# Patient Record
Sex: Female | Born: 1970 | Race: White | Hispanic: No | Marital: Married | Smoking: Former smoker
Health system: Southern US, Community
[De-identification: ages and names within clinical notes are randomized; demographics above are authoritative.]

## PROBLEM LIST (undated history)

## (undated) DIAGNOSIS — I10 Essential (primary) hypertension: Secondary | ICD-10-CM

## (undated) DIAGNOSIS — Z8489 Family history of other specified conditions: Secondary | ICD-10-CM

## (undated) HISTORY — PX: TYMPANOSTOMY TUBE PLACEMENT: SHX32

## (undated) HISTORY — PX: ADENOIDECTOMY AND MYRINGOTOMY WITH TUBE PLACEMENT: SHX5714

---

## 1997-12-01 ENCOUNTER — Ambulatory Visit (HOSPITAL_COMMUNITY): Admission: RE | Admit: 1997-12-01 | Discharge: 1997-12-01 | Payer: Self-pay | Admitting: Obstetrics and Gynecology

## 1997-12-02 ENCOUNTER — Ambulatory Visit (HOSPITAL_COMMUNITY): Admission: RE | Admit: 1997-12-02 | Discharge: 1997-12-02 | Payer: Self-pay | Admitting: Obstetrics and Gynecology

## 1998-02-25 ENCOUNTER — Inpatient Hospital Stay (HOSPITAL_COMMUNITY): Admission: AD | Admit: 1998-02-25 | Discharge: 1998-02-27 | Payer: Self-pay | Admitting: Obstetrics and Gynecology

## 1999-08-09 ENCOUNTER — Encounter: Admission: RE | Admit: 1999-08-09 | Discharge: 1999-08-09 | Payer: Self-pay | Admitting: Obstetrics and Gynecology

## 1999-08-09 ENCOUNTER — Encounter: Payer: Self-pay | Admitting: Obstetrics and Gynecology

## 1999-10-18 ENCOUNTER — Other Ambulatory Visit: Admission: RE | Admit: 1999-10-18 | Discharge: 1999-10-18 | Payer: Self-pay | Admitting: Obstetrics and Gynecology

## 2000-10-23 ENCOUNTER — Other Ambulatory Visit: Admission: RE | Admit: 2000-10-23 | Discharge: 2000-10-23 | Payer: Self-pay | Admitting: Obstetrics and Gynecology

## 2000-10-30 ENCOUNTER — Encounter: Admission: RE | Admit: 2000-10-30 | Discharge: 2000-10-30 | Payer: Self-pay | Admitting: Obstetrics and Gynecology

## 2000-10-30 ENCOUNTER — Encounter: Payer: Self-pay | Admitting: Obstetrics and Gynecology

## 2003-03-24 ENCOUNTER — Other Ambulatory Visit: Admission: RE | Admit: 2003-03-24 | Discharge: 2003-03-24 | Payer: Self-pay | Admitting: Obstetrics and Gynecology

## 2006-01-16 ENCOUNTER — Encounter: Admission: RE | Admit: 2006-01-16 | Discharge: 2006-01-16 | Payer: Self-pay | Admitting: Obstetrics and Gynecology

## 2010-03-01 ENCOUNTER — Encounter: Admission: RE | Admit: 2010-03-01 | Discharge: 2010-03-01 | Payer: Self-pay | Admitting: Obstetrics and Gynecology

## 2012-09-05 ENCOUNTER — Other Ambulatory Visit: Payer: Self-pay

## 2012-09-05 DIAGNOSIS — Z1231 Encounter for screening mammogram for malignant neoplasm of breast: Secondary | ICD-10-CM

## 2012-10-15 ENCOUNTER — Ambulatory Visit
Admission: RE | Admit: 2012-10-15 | Discharge: 2012-10-15 | Disposition: A | Discharge status: Home / Self Care | Payer: BC Managed Care – PPO | Source: Ambulatory Visit

## 2012-10-15 DIAGNOSIS — Z1231 Encounter for screening mammogram for malignant neoplasm of breast: Secondary | ICD-10-CM

## 2013-01-30 ENCOUNTER — Ambulatory Visit (INDEPENDENT_AMBULATORY_CARE_PROVIDER_SITE_OTHER): Payer: BC Managed Care – PPO | Admitting: General Surgery

## 2013-02-20 ENCOUNTER — Encounter (INDEPENDENT_AMBULATORY_CARE_PROVIDER_SITE_OTHER): Payer: Self-pay | Admitting: General Surgery

## 2013-02-20 ENCOUNTER — Ambulatory Visit (INDEPENDENT_AMBULATORY_CARE_PROVIDER_SITE_OTHER): Payer: BC Managed Care – PPO | Admitting: General Surgery

## 2013-02-20 VITALS — BP 118/76 | HR 72 | Temp 98.2°F | Resp 14 | Ht 64.0 in | Wt 185.4 lb

## 2013-02-20 DIAGNOSIS — K439 Ventral hernia without obstruction or gangrene: Secondary | ICD-10-CM

## 2013-02-20 NOTE — Patient Instructions (Signed)
Call office and asked for scheduling when you decide regarding timing of your surgery

## 2013-02-20 NOTE — Progress Notes (Signed)
Patient ID: Nicole Carr, female   DOB: 04/06/1971, 42 y.o.   MRN: [id number]  Chief Complaint  Patient presents with  . New Evaluation    eval ventral hernia    HPI Nicole Carr is a 42 y.o. female.  Chief complaint abdominal hernia HPIPatient has a two-month history of a painful mass on her abdomen above her umbilicus. She was seen by Dr. Cliffton Asters. Dr. Cliffton Asters asked me to evaluate her in consultation for possible hernia. The lump seems to get bigger and smaller over time. It bothers her more if she bumps into it or she does activity that use her abdominal muscles. No changes in bowel habits. She has no pain usually just a feeling of pressure in the area.  History reviewed. No pertinent past medical history.  History reviewed. No pertinent past surgical history.  Family History  Problem Relation Age of Onset  . Cancer Father     melenoma  . Cancer Sister     breast    Social History History  Substance Use Topics  . Smoking status: Former Smoker    Quit date: 10/22/2010  . Smokeless tobacco: Never Used  . Alcohol Use: Yes    No Known Allergies  Current Outpatient Prescriptions  Medication Sig Dispense Refill  . LORazepam (ATIVAN) 0.5 MG tablet Patient takes as needed      . Vitamin D, Ergocalciferol, (DRISDOL) 50000 UNITS CAPS capsule        No current facility-administered medications for this visit.    Review of Systems Review of Systems  Constitutional: Negative for fever, chills and unexpected weight change.  HENT: Negative for congestion, hearing loss, sore throat, trouble swallowing and voice change.   Eyes: Negative for visual disturbance.  Respiratory: Negative for cough and wheezing.   Cardiovascular: Negative for chest pain, palpitations and leg swelling.  Gastrointestinal: Positive for abdominal pain. Negative for nausea, vomiting, diarrhea, constipation, blood in stool, abdominal distention and anal bleeding.       See history of present illness    Genitourinary: Negative for hematuria, vaginal bleeding and difficulty urinating.  Musculoskeletal: Negative for arthralgias.  Skin: Negative for rash and wound.  Neurological: Negative for seizures, syncope and headaches.  Hematological: Negative for adenopathy. Does not bruise/bleed easily.  Psychiatric/Behavioral: Negative for confusion.    Blood pressure 118/76, pulse 72, temperature 98.2 F (36.8 C), temperature source Temporal, resp. rate 14, height 5\' 4"  (1.626 m), weight 185 lb 6.4 oz (84.097 kg).  Physical Exam Physical Exam  Constitutional: She is oriented to person, place, and time. She appears well-developed and well-nourished.  HENT:  Head: Normocephalic and atraumatic.  Eyes: EOM are normal. Pupils are equal, round, and reactive to light.  Neck: Normal range of motion. Neck supple. No tracheal deviation present.  Cardiovascular: Normal rate, normal heart sounds and intact distal pulses.   Pulmonary/Chest: Effort normal and breath sounds normal. No stridor. No respiratory distress. She has no wheezes. She has no rales.  Abdominal: Soft. She exhibits no distension. There is no rebound.    Epigastric hernia a couple centimeters above the umbilicus, incompletely reducible, some tenderness on palpation, no overlying skin redness  Musculoskeletal: Normal range of motion. She exhibits no edema.  Neurological: She is alert and oriented to person, place, and time.  Skin: Skin is warm and dry.    Data Reviewed Office notes from Dr. Cliffton Asters  Assessment    Epigastric hernia    Plan    I have offered laparoscopic repair  with mesh. Procedure, risks, and benefits were discussed in detail. She would like to check her schedule. She will likely choose to have it repaired beginning of the next year. She will call and speak with our schedulers after talking things over with her husband.I advised of signs and symptoms of strangulation and gave her some educational material regarding  laparoscopic hernia repair.       Mollie Rossano E 02/20/2013, 11:38 AM

## 2014-09-01 ENCOUNTER — Other Ambulatory Visit: Payer: Self-pay

## 2014-09-01 DIAGNOSIS — Z1231 Encounter for screening mammogram for malignant neoplasm of breast: Secondary | ICD-10-CM

## 2014-09-05 ENCOUNTER — Ambulatory Visit
Admission: RE | Admit: 2014-09-05 | Discharge: 2014-09-05 | Disposition: A | Discharge status: Home / Self Care | Payer: BLUE CROSS/BLUE SHIELD | Source: Ambulatory Visit

## 2014-09-05 DIAGNOSIS — Z1231 Encounter for screening mammogram for malignant neoplasm of breast: Secondary | ICD-10-CM

## 2015-08-25 ENCOUNTER — Other Ambulatory Visit: Payer: Self-pay

## 2015-08-25 DIAGNOSIS — Z1231 Encounter for screening mammogram for malignant neoplasm of breast: Secondary | ICD-10-CM

## 2015-09-14 ENCOUNTER — Ambulatory Visit
Admission: RE | Admit: 2015-09-14 | Discharge: 2015-09-14 | Disposition: A | Discharge status: Home / Self Care | Payer: BLUE CROSS/BLUE SHIELD | Source: Ambulatory Visit

## 2015-09-14 DIAGNOSIS — Z1231 Encounter for screening mammogram for malignant neoplasm of breast: Secondary | ICD-10-CM

## 2015-11-30 ENCOUNTER — Ambulatory Visit (INDEPENDENT_AMBULATORY_CARE_PROVIDER_SITE_OTHER): Payer: BLUE CROSS/BLUE SHIELD | Admitting: Licensed Clinical Social Worker

## 2015-11-30 DIAGNOSIS — F331 Major depressive disorder, recurrent, moderate: Secondary | ICD-10-CM

## 2015-12-01 ENCOUNTER — Ambulatory Visit: Payer: BLUE CROSS/BLUE SHIELD | Admitting: Licensed Clinical Social Worker

## 2015-12-15 ENCOUNTER — Ambulatory Visit (INDEPENDENT_AMBULATORY_CARE_PROVIDER_SITE_OTHER): Payer: BLUE CROSS/BLUE SHIELD | Admitting: Licensed Clinical Social Worker

## 2015-12-15 DIAGNOSIS — F331 Major depressive disorder, recurrent, moderate: Secondary | ICD-10-CM | POA: Diagnosis not present

## 2016-02-10 ENCOUNTER — Ambulatory Visit: Payer: Self-pay | Admitting: General Surgery

## 2016-03-25 NOTE — Pre-Procedure Instructions (Signed)
    Nicole Carr  03/25/2016      [facility] [place], [place] - [address] [place] [address] Phone: [phone] Fax: [phone]    Your procedure is scheduled on 04/04/16.  [facility] at 530 A.M.  Call this number if you have problems the morning of surgery:  [phone]   Remember:  Do not eat food or drink liquids after midnight.  Take these medicines the morning of surgery with A SIP OF WATER ---ativan   Do not wear jewelry, make-up or nail polish.  Do not wear lotions, powders, or perfumes, or deoderant.  Do not shave 48 hours prior to surgery.  Men may shave face and neck.  Do not bring valuables to the hospital.  [facility] is not responsible for any belongings or valuables.  Contacts, dentures or bridgework may not be worn into surgery.  Leave your suitcase in the car.  After surgery it may be brought to your room.  For patients admitted to the hospital, discharge time will be determined by your treatment team.  Patients discharged the day of surgery will not be allowed to drive home.   Name and phone number of your driver:   Special instructions:  Do not take any aspirin,anti-inflammatories,vitamins,or herbal supplements 5-7 days prior to surgery.  Please read over the following fact sheets that you were given.

## 2016-03-28 ENCOUNTER — Other Ambulatory Visit (HOSPITAL_COMMUNITY): Payer: Self-pay | Admitting: *Deleted

## 2016-03-28 ENCOUNTER — Encounter (HOSPITAL_COMMUNITY): Payer: Self-pay

## 2016-03-28 ENCOUNTER — Encounter (HOSPITAL_COMMUNITY)
Admission: RE | Admit: 2016-03-28 | Discharge: 2016-03-28 | Disposition: A | Discharge status: Home / Self Care | Payer: BLUE CROSS/BLUE SHIELD | Source: Ambulatory Visit | Attending: General Surgery | Admitting: General Surgery

## 2016-03-28 DIAGNOSIS — I10 Essential (primary) hypertension: Secondary | ICD-10-CM | POA: Insufficient documentation

## 2016-03-28 DIAGNOSIS — Z01810 Encounter for preprocedural cardiovascular examination: Secondary | ICD-10-CM | POA: Diagnosis present

## 2016-03-28 DIAGNOSIS — Z01812 Encounter for preprocedural laboratory examination: Secondary | ICD-10-CM | POA: Insufficient documentation

## 2016-03-28 HISTORY — DX: Family history of other specified conditions: Z84.89

## 2016-03-28 HISTORY — DX: Essential (primary) hypertension: I10

## 2016-03-28 LAB — BASIC METABOLIC PANEL
Anion gap: 6 (ref 5–15)
BUN: 10 mg/dL (ref 6–20)
CHLORIDE: 105 mmol/L (ref 101–111)
CO2: 26 mmol/L (ref 22–32)
CREATININE: 0.72 mg/dL (ref 0.44–1.00)
Calcium: 9.6 mg/dL (ref 8.9–10.3)
GFR calc non Af Amer: >60 mL/min (ref >60)
Glucose, Bld: 97 mg/dL (ref 65–99)
POTASSIUM: 3.9 mmol/L (ref 3.5–5.1)
SODIUM: 137 mmol/L (ref 135–145)

## 2016-03-28 LAB — CBC
HEMATOCRIT: 37.7 % (ref 36.0–46.0)
HEMOGLOBIN: 12.6 g/dL (ref 12.0–15.0)
MCH: 30.6 pg (ref 26.0–34.0)
MCHC: 33.4 g/dL (ref 30.0–36.0)
MCV: 91.5 fL (ref 78.0–100.0)
PLATELETS: 253 10*3/uL (ref 150–400)
RBC: 4.12 MIL/uL (ref 3.87–5.11)
RDW: 12.5 % (ref 11.5–15.5)
WBC: 6.6 10*3/uL (ref 4.0–10.5)

## 2016-03-28 NOTE — Pre-Procedure Instructions (Signed)
Nicole Carr  03/28/2016     Your procedure is scheduled on Monday, April 04, 2016 at 7:30 AM.   Report to [facility] "A" Admitting Office at 5:30 AM.   Call this number if you have problems the morning of surgery: [phone]   Questions prior to day of surgery, please call [phone] between 8 & 4 PM.   Remember:  Do not eat food or drink liquids after midnight Sunday 04/03/16.  Take these medicines the morning of surgery with A SIP OF WATER: Lorazepam (Ativan) - if needed  Stop NSAIDS (Ibuprofen, Aleve, etc.) as of today.   Do not wear jewelry, make-up or nail polish.  Do not wear lotions, powders, or perfumes.  Do not shave 48 hours prior to surgery.    Do not bring valuables to the hospital.  [facility] is not responsible for any belongings or valuables.  Contacts, dentures or bridgework may not be worn into surgery.  Leave your suitcase in the car.  After surgery it may be brought to your room.  For patients admitted to the hospital, discharge time will be determined by your treatment team.  Patients discharged the day of surgery will not be allowed to drive home.   Special instructions:  [facility] - Preparing for Surgery  Before surgery, you can play an important role.  Because skin is not sterile, your skin needs to be as free of germs as possible.  You can reduce the number of germs on you skin by washing with CHG (chlorahexidine gluconate) soap before surgery.  CHG is an antiseptic cleaner which kills germs and bonds with the skin to continue killing germs even after washing.  Please DO NOT use if you have an allergy to CHG or antibacterial soaps.  If your skin becomes reddened/irritated stop using the CHG and inform your nurse when you arrive at Short Stay.  Do not shave (including legs and underarms) for at least 48 hours prior to the first CHG shower.  You may shave your face.  Please follow these instructions carefully:   1.   Shower with CHG Soap the night before surgery and the                    morning of Surgery.  2.  If you choose to wash your hair, wash your hair first as usual with your       normal shampoo.  3.  After you shampoo, rinse your hair and body thoroughly to remove the shampoo.  4.  Use CHG as you would any other liquid soap.  You can apply chg directly       to the skin and wash gently with scrungie or a clean washcloth.  5.  Apply the CHG Soap to your body ONLY FROM THE NECK DOWN.        Do not use on open wounds or open sores.  Avoid contact with your eyes, ears, mouth and genitals (private parts).  Wash genitals (private parts) with your normal soap.  6.  Wash thoroughly, paying special attention to the area where your surgery        will be performed.  7.  Thoroughly rinse your body with warm water from the neck down.  8.  DO NOT shower/wash with your normal soap after using and rinsing off       the CHG Soap.  9.  Pat yourself dry with a clean towel.  10.  Wear clean pajamas.            11.  Place clean sheets on your bed the night of your first shower and do not        sleep with pets.  Day of Surgery  Do not apply any lotions the morning of surgery.  Please wear clean clothes to the hospital.   Please read over the fact sheets that you were given.

## 2016-04-03 ENCOUNTER — Encounter (HOSPITAL_COMMUNITY): Payer: Self-pay | Admitting: Anesthesiology

## 2016-04-03 NOTE — Anesthesia Preprocedure Evaluation (Addendum)
Anesthesia Evaluation  Patient identified by MRN, date of birth, ID band Patient awake    Reviewed: Allergy & Precautions, NPO status , Patient's Chart, lab work & pertinent test results  Airway Mallampati: II  TM Distance: >3 FB Neck ROM: Full    Dental  (+) Teeth Intact, Dental Advisory Given   Pulmonary former smoker,    breath sounds clear to auscultation       Cardiovascular hypertension, Pt. on medications  Rhythm:Regular Rate:Normal     Neuro/Psych negative neurological ROS  negative psych ROS   GI/Hepatic negative GI ROS, Neg liver ROS,   Endo/Other  negative endocrine ROS  Renal/GU negative Renal ROS  negative genitourinary   Musculoskeletal negative musculoskeletal ROS (+)   Abdominal   Peds negative pediatric ROS (+)  Hematology negative hematology ROS (+)   Anesthesia Other Findings   Reproductive/Obstetrics negative OB ROS                            Lab Results  Component Value Date   WBC 6.6 03/28/2016   HGB 12.6 03/28/2016   HCT 37.7 03/28/2016   MCV 91.5 03/28/2016   PLT 253 03/28/2016   Lab Results  Component Value Date   CREATININE 0.72 03/28/2016   BUN 10 03/28/2016   NA 137 03/28/2016   K 3.9 03/28/2016   CL 105 03/28/2016   CO2 26 03/28/2016   No results found for: INR, PROTIME  03/2016 EKG: NSR  Anesthesia Physical Anesthesia Plan  ASA: II  Anesthesia Plan: General   Post-op Pain Management:    Induction: Intravenous  Airway Management Planned: Oral ETT  Additional Equipment:   Intra-op Plan:   Post-operative Plan: Extubation in OR  Informed Consent: I have reviewed the patients History and Physical, chart, labs and discussed the procedure including the risks, benefits and alternatives for the proposed anesthesia with the patient or authorized representative who has indicated his/her understanding and acceptance.     Plan Discussed  with: CRNA  Anesthesia Plan Comments:         Anesthesia Quick Evaluation

## 2016-04-04 ENCOUNTER — Ambulatory Visit (HOSPITAL_COMMUNITY): Payer: BLUE CROSS/BLUE SHIELD | Admitting: Anesthesiology

## 2016-04-04 ENCOUNTER — Encounter (HOSPITAL_COMMUNITY): Payer: Self-pay | Admitting: *Deleted

## 2016-04-04 ENCOUNTER — Observation Stay (HOSPITAL_COMMUNITY)
Admission: RE | Admit: 2016-04-04 | Discharge: 2016-04-06 | Disposition: A | Discharge status: Home / Self Care | Payer: BLUE CROSS/BLUE SHIELD | Source: Ambulatory Visit | Attending: General Surgery | Admitting: General Surgery

## 2016-04-04 ENCOUNTER — Encounter (HOSPITAL_COMMUNITY)
Admission: RE | Disposition: A | Discharge status: Home / Self Care | Payer: Self-pay | Source: Ambulatory Visit | Attending: General Surgery

## 2016-04-04 DIAGNOSIS — Z8261 Family history of arthritis: Secondary | ICD-10-CM | POA: Insufficient documentation

## 2016-04-04 DIAGNOSIS — Z9889 Other specified postprocedural states: Secondary | ICD-10-CM

## 2016-04-04 DIAGNOSIS — Z8249 Family history of ischemic heart disease and other diseases of the circulatory system: Secondary | ICD-10-CM | POA: Diagnosis not present

## 2016-04-04 DIAGNOSIS — Z803 Family history of malignant neoplasm of breast: Secondary | ICD-10-CM | POA: Diagnosis not present

## 2016-04-04 DIAGNOSIS — Z87891 Personal history of nicotine dependence: Secondary | ICD-10-CM | POA: Diagnosis not present

## 2016-04-04 DIAGNOSIS — K439 Ventral hernia without obstruction or gangrene: Principal | ICD-10-CM | POA: Insufficient documentation

## 2016-04-04 DIAGNOSIS — F419 Anxiety disorder, unspecified: Secondary | ICD-10-CM | POA: Diagnosis not present

## 2016-04-04 DIAGNOSIS — Z8719 Personal history of other diseases of the digestive system: Secondary | ICD-10-CM

## 2016-04-04 DIAGNOSIS — I10 Essential (primary) hypertension: Secondary | ICD-10-CM | POA: Insufficient documentation

## 2016-04-04 HISTORY — PX: HERNIA REPAIR: SHX51

## 2016-04-04 HISTORY — PX: EPIGASTRIC HERNIA REPAIR: SHX404

## 2016-04-04 HISTORY — PX: INSERTION OF MESH: SHX5868

## 2016-04-04 LAB — HCG, SERUM, QUALITATIVE: Preg, Serum: NEGATIVE

## 2016-04-04 SURGERY — REPAIR, HERNIA, EPIGASTRIC, ADULT
Anesthesia: General | Site: Abdomen

## 2016-04-04 MED ORDER — DEXAMETHASONE SODIUM PHOSPHATE 10 MG/ML IJ SOLN
INTRAMUSCULAR | Status: AC
  Filled 2016-04-04: qty 1

## 2016-04-04 MED ORDER — LORAZEPAM 0.5 MG PO TABS: 0.5000 mg | ORAL_TABLET | Freq: Every day | ORAL | Status: DC | PRN

## 2016-04-04 MED ORDER — HYDROMORPHONE HCL 2 MG/ML IJ SOLN
INTRAMUSCULAR | Status: AC
Start: 2016-04-04 — End: 2016-04-04
  Filled 2016-04-04: qty 1

## 2016-04-04 MED ORDER — FENTANYL CITRATE (PF) 100 MCG/2ML IJ SOLN
INTRAMUSCULAR | Status: AC
  Filled 2016-04-04: qty 2

## 2016-04-04 MED ORDER — SUGAMMADEX SODIUM 200 MG/2ML IV SOLN
INTRAVENOUS | Status: DC | PRN
  Administered 2016-04-04: 200 mg via INTRAVENOUS

## 2016-04-04 MED ORDER — ONDANSETRON 4 MG PO TBDP: 4.0000 mg | ORAL_TABLET | Freq: Four times a day (QID) | ORAL | Status: DC | PRN

## 2016-04-04 MED ORDER — LACTATED RINGERS IV SOLN
INTRAVENOUS | Status: DC | PRN
  Administered 2016-04-04 (×2): via INTRAVENOUS

## 2016-04-04 MED ORDER — IBUPROFEN 400 MG PO TABS: 400.0000 mg | ORAL_TABLET | Freq: Four times a day (QID) | ORAL | Status: DC | PRN

## 2016-04-04 MED ORDER — EPHEDRINE 5 MG/ML INJ
INTRAVENOUS | Status: AC
  Filled 2016-04-04: qty 10

## 2016-04-04 MED ORDER — BUPIVACAINE HCL (PF) 0.5 % IJ SOLN
INTRAMUSCULAR | Status: DC | PRN
  Administered 2016-04-04: 17 mL

## 2016-04-04 MED ORDER — OXYCODONE HCL 5 MG PO TABS
5.0000 mg | ORAL_TABLET | ORAL | Status: DC | PRN
  Administered 2016-04-04 – 2016-04-05 (×5): 10 mg via ORAL
  Filled 2016-04-04 (×5): qty 2

## 2016-04-04 MED ORDER — ONDANSETRON HCL 4 MG/2ML IJ SOLN
INTRAMUSCULAR | Status: DC | PRN
Start: 2016-04-04 — End: 2016-04-04
  Administered 2016-04-04: 4 mg via INTRAVENOUS

## 2016-04-04 MED ORDER — METHOCARBAMOL 500 MG PO TABS
500.0000 mg | ORAL_TABLET | Freq: Four times a day (QID) | ORAL | Status: DC | PRN
  Administered 2016-04-04 – 2016-04-06 (×6): 500 mg via ORAL
  Filled 2016-04-04 (×7): qty 1

## 2016-04-04 MED ORDER — HYDROMORPHONE HCL 1 MG/ML IJ SOLN
0.2500 mg | INTRAMUSCULAR | Status: DC | PRN
  Administered 2016-04-04 (×4): 0.5 mg via INTRAVENOUS

## 2016-04-04 MED ORDER — ROCURONIUM BROMIDE 100 MG/10ML IV SOLN
INTRAVENOUS | Status: DC | PRN
  Administered 2016-04-04: 10 mg via INTRAVENOUS
  Administered 2016-04-04: 50 mg via INTRAVENOUS

## 2016-04-04 MED ORDER — CHLORHEXIDINE GLUCONATE CLOTH 2 % EX PADS: 6.0000 | MEDICATED_PAD | Freq: Once | CUTANEOUS | Status: DC

## 2016-04-04 MED ORDER — ONDANSETRON HCL 4 MG/2ML IJ SOLN: 4.0000 mg | Freq: Four times a day (QID) | INTRAMUSCULAR | Status: DC | PRN

## 2016-04-04 MED ORDER — SUGAMMADEX SODIUM 200 MG/2ML IV SOLN
INTRAVENOUS | Status: AC
  Filled 2016-04-04: qty 2

## 2016-04-04 MED ORDER — DIPHENHYDRAMINE HCL 25 MG PO CAPS: 25.0000 mg | ORAL_CAPSULE | Freq: Four times a day (QID) | ORAL | Status: DC | PRN

## 2016-04-04 MED ORDER — ROCURONIUM BROMIDE 10 MG/ML (PF) SYRINGE
PREFILLED_SYRINGE | INTRAVENOUS | Status: AC
  Filled 2016-04-04: qty 10

## 2016-04-04 MED ORDER — PHENYLEPHRINE 40 MCG/ML (10ML) SYRINGE FOR IV PUSH (FOR BLOOD PRESSURE SUPPORT)
PREFILLED_SYRINGE | INTRAVENOUS | Status: AC
  Filled 2016-04-04: qty 10

## 2016-04-04 MED ORDER — PROMETHAZINE HCL 25 MG/ML IJ SOLN: 6.2500 mg | INTRAMUSCULAR | Status: DC | PRN

## 2016-04-04 MED ORDER — HYDROMORPHONE HCL 2 MG/ML IJ SOLN
1.0000 mg | INTRAMUSCULAR | Status: DC | PRN
Start: 2016-04-04 — End: 2016-04-06
  Administered 2016-04-04 – 2016-04-05 (×2): 1 mg via INTRAVENOUS
  Filled 2016-04-04 (×2): qty 1

## 2016-04-04 MED ORDER — LISINOPRIL 5 MG PO TABS
5.0000 mg | ORAL_TABLET | Freq: Every day | ORAL | Status: DC
  Administered 2016-04-04 – 2016-04-06 (×3): 5 mg via ORAL
  Filled 2016-04-04 (×3): qty 1

## 2016-04-04 MED ORDER — CEFAZOLIN SODIUM-DEXTROSE 2-4 GM/100ML-% IV SOLN
2.0000 g | INTRAVENOUS | Status: AC
  Administered 2016-04-04: 2 g via INTRAVENOUS
  Filled 2016-04-04: qty 100

## 2016-04-04 MED ORDER — OXYCODONE HCL 5 MG PO TABS
ORAL_TABLET | ORAL | Status: AC
  Filled 2016-04-04: qty 2

## 2016-04-04 MED ORDER — EPINEPHRINE PF 1 MG/ML IJ SOLN
INTRAMUSCULAR | Status: AC
  Filled 2016-04-04: qty 1

## 2016-04-04 MED ORDER — MIDAZOLAM HCL 5 MG/5ML IJ SOLN
INTRAMUSCULAR | Status: DC | PRN
Start: 2016-04-04 — End: 2016-04-04
  Administered 2016-04-04: 2 mg via INTRAVENOUS

## 2016-04-04 MED ORDER — ENOXAPARIN SODIUM 40 MG/0.4ML ~~LOC~~ SOLN
40.0000 mg | SUBCUTANEOUS | Status: DC
  Administered 2016-04-05 – 2016-04-06 (×2): 40 mg via SUBCUTANEOUS
  Filled 2016-04-04 (×2): qty 0.4

## 2016-04-04 MED ORDER — PROPOFOL 10 MG/ML IV BOLUS
INTRAVENOUS | Status: AC
  Filled 2016-04-04: qty 20

## 2016-04-04 MED ORDER — LIDOCAINE HCL (CARDIAC) 20 MG/ML IV SOLN
INTRAVENOUS | Status: DC | PRN
  Administered 2016-04-04: 80 mg via INTRAVENOUS

## 2016-04-04 MED ORDER — 0.9 % SODIUM CHLORIDE (POUR BTL) OPTIME
TOPICAL | Status: DC | PRN
  Administered 2016-04-04: 1000 mL

## 2016-04-04 MED ORDER — PANTOPRAZOLE SODIUM 40 MG PO TBEC
40.0000 mg | DELAYED_RELEASE_TABLET | Freq: Every day | ORAL | Status: DC
  Administered 2016-04-04 – 2016-04-06 (×3): 40 mg via ORAL
  Filled 2016-04-04 (×3): qty 1

## 2016-04-04 MED ORDER — PROPOFOL 10 MG/ML IV BOLUS
INTRAVENOUS | Status: DC | PRN
Start: 2016-04-04 — End: 2016-04-04
  Administered 2016-04-04: 150 mg via INTRAVENOUS

## 2016-04-04 MED ORDER — LIDOCAINE 2% (20 MG/ML) 5 ML SYRINGE
INTRAMUSCULAR | Status: AC
  Filled 2016-04-04: qty 10

## 2016-04-04 MED ORDER — FENTANYL CITRATE (PF) 100 MCG/2ML IJ SOLN
INTRAMUSCULAR | Status: DC | PRN
  Administered 2016-04-04 (×2): 50 ug via INTRAVENOUS

## 2016-04-04 MED ORDER — SUCCINYLCHOLINE CHLORIDE 200 MG/10ML IV SOSY
PREFILLED_SYRINGE | INTRAVENOUS | Status: AC
  Filled 2016-04-04: qty 10

## 2016-04-04 MED ORDER — METHOCARBAMOL 500 MG PO TABS
ORAL_TABLET | ORAL | Status: AC
  Filled 2016-04-04: qty 1

## 2016-04-04 MED ORDER — KCL IN DEXTROSE-NACL 20-5-0.45 MEQ/L-%-% IV SOLN
INTRAVENOUS | Status: DC
Start: 2016-04-04 — End: 2016-04-05
  Administered 2016-04-04 – 2016-04-05 (×2): via INTRAVENOUS
  Filled 2016-04-04 (×2): qty 1000

## 2016-04-04 MED ORDER — DEXAMETHASONE SODIUM PHOSPHATE 10 MG/ML IJ SOLN
INTRAMUSCULAR | Status: DC | PRN
  Administered 2016-04-04: 8 mg via INTRAVENOUS

## 2016-04-04 MED ORDER — ONDANSETRON HCL 4 MG/2ML IJ SOLN
INTRAMUSCULAR | Status: AC
  Filled 2016-04-04: qty 2

## 2016-04-04 MED ORDER — MEPERIDINE HCL 25 MG/ML IJ SOLN: 6.2500 mg | INTRAMUSCULAR | Status: DC | PRN

## 2016-04-04 MED ORDER — PHENYLEPHRINE HCL 10 MG/ML IJ SOLN
INTRAMUSCULAR | Status: DC | PRN
  Administered 2016-04-04 (×2): 80 ug via INTRAVENOUS

## 2016-04-04 MED ORDER — LACTATED RINGERS IV SOLN: INTRAVENOUS | Status: DC

## 2016-04-04 MED ORDER — DIPHENHYDRAMINE HCL 50 MG/ML IJ SOLN
25.0000 mg | Freq: Four times a day (QID) | INTRAMUSCULAR | Status: DC | PRN
Start: 2016-04-04 — End: 2016-04-06

## 2016-04-04 MED ORDER — MIDAZOLAM HCL 2 MG/2ML IJ SOLN
INTRAMUSCULAR | Status: AC
  Filled 2016-04-04: qty 2

## 2016-04-04 MED ORDER — BUPIVACAINE HCL (PF) 0.5 % IJ SOLN
INTRAMUSCULAR | Status: AC
  Filled 2016-04-04: qty 30

## 2016-04-04 MED ORDER — EPHEDRINE SULFATE 50 MG/ML IJ SOLN
INTRAMUSCULAR | Status: DC | PRN
  Administered 2016-04-04: 10 mg via INTRAVENOUS

## 2016-04-04 MED ORDER — ZOLPIDEM TARTRATE 5 MG PO TABS: 5.0000 mg | ORAL_TABLET | Freq: Every evening | ORAL | Status: DC | PRN

## 2016-04-04 SURGICAL SUPPLY — 62 items
APPLICATOR COTTON TIP 6IN STRL (MISCELLANEOUS) ×3 IMPLANT
APPLIER CLIP 5 13 M/L LIGAMAX5 (MISCELLANEOUS)
APPLIER CLIP ROT 10 11.4 M/L (STAPLE)
BINDER ABDOMINAL 12 ML 46-62 (SOFTGOODS) ×3 IMPLANT
BLADE SURG ROTATE 9660 (MISCELLANEOUS) IMPLANT
CANISTER SUCTION 2500CC (MISCELLANEOUS) IMPLANT
CHLORAPREP W/TINT 26ML (MISCELLANEOUS) ×3 IMPLANT
CLIP APPLIE 5 13 M/L LIGAMAX5 (MISCELLANEOUS) IMPLANT
CLIP APPLIE ROT 10 11.4 M/L (STAPLE) IMPLANT
COVER SURGICAL LIGHT HANDLE (MISCELLANEOUS) ×3 IMPLANT
DERMABOND ADVANCED (GAUZE/BANDAGES/DRESSINGS) ×2
DERMABOND ADVANCED .7 DNX12 (GAUZE/BANDAGES/DRESSINGS) ×1 IMPLANT
DEVICE SECURE STRAP 25 ABSORB (INSTRUMENTS) ×3 IMPLANT
DEVICE TROCAR PUNCTURE CLOSURE (ENDOMECHANICALS) ×3 IMPLANT
DRAPE LAPAROSCOPIC ABDOMINAL (DRAPES) IMPLANT
DRAPE LAPAROTOMY 100X72 PEDS (DRAPES) IMPLANT
DRAPE UTILITY XL STRL (DRAPES) IMPLANT
ELECT REM PT RETURN 9FT ADLT (ELECTROSURGICAL) ×3
ELECTRODE REM PT RTRN 9FT ADLT (ELECTROSURGICAL) ×1 IMPLANT
FILTER SMOKE EVAC LAPAROSHD (FILTER) IMPLANT
GLOVE BIO SURGEON STRL SZ8 (GLOVE) ×6 IMPLANT
GLOVE BIOGEL PI IND STRL 8 (GLOVE) ×2 IMPLANT
GLOVE BIOGEL PI INDICATOR 8 (GLOVE) ×4
GOWN STRL REUS W/ TWL LRG LVL3 (GOWN DISPOSABLE) ×1 IMPLANT
GOWN STRL REUS W/ TWL XL LVL3 (GOWN DISPOSABLE) ×3 IMPLANT
GOWN STRL REUS W/TWL LRG LVL3 (GOWN DISPOSABLE) ×2
GOWN STRL REUS W/TWL XL LVL3 (GOWN DISPOSABLE) ×6
KIT BASIN OR (CUSTOM PROCEDURE TRAY) ×3 IMPLANT
KIT ROOM TURNOVER OR (KITS) ×3 IMPLANT
MARKER SKIN DUAL TIP RULER LAB (MISCELLANEOUS) ×3 IMPLANT
MESH VENTRALIGHT ST 6IN CRC (Mesh General) ×3 IMPLANT
NEEDLE 22X1 1/2 (OR ONLY) (NEEDLE) ×3 IMPLANT
NEEDLE SPNL 22GX3.5 QUINCKE BK (NEEDLE) ×3 IMPLANT
NS IRRIG 1000ML POUR BTL (IV SOLUTION) ×3 IMPLANT
PACK GENERAL/GYN (CUSTOM PROCEDURE TRAY) IMPLANT
PAD ARMBOARD 7.5X6 YLW CONV (MISCELLANEOUS) ×6 IMPLANT
POUCH SPECIMEN RETRIEVAL 10MM (ENDOMECHANICALS) ×3 IMPLANT
SCALPEL HARMONIC ACE (MISCELLANEOUS) ×3 IMPLANT
SCISSORS LAP 5X35 DISP (ENDOMECHANICALS) ×3 IMPLANT
SET IRRIG TUBING LAPAROSCOPIC (IRRIGATION / IRRIGATOR) IMPLANT
SLEEVE ENDOPATH XCEL 5M (ENDOMECHANICALS) ×6 IMPLANT
SPECIMEN JAR SMALL (MISCELLANEOUS) IMPLANT
SUT MNCRL AB 4-0 PS2 18 (SUTURE) IMPLANT
SUT PROLENE 0 CT 1 30 (SUTURE) IMPLANT
SUT PROLENE 0 CT 1 CR/8 (SUTURE) ×3 IMPLANT
SUT PROLENE 0 CT 2 (SUTURE) IMPLANT
SUT VIC AB 2-0 CT1 27 (SUTURE)
SUT VIC AB 2-0 CT1 TAPERPNT 27 (SUTURE) IMPLANT
SUT VIC AB 3-0 SH 27 (SUTURE)
SUT VIC AB 3-0 SH 27XBRD (SUTURE) IMPLANT
SUT VIC AB 4-0 PS2 27 (SUTURE) ×3 IMPLANT
SUT VICRYL 0 TIES 12 18 (SUTURE) IMPLANT
SYR CONTROL 10ML LL (SYRINGE) IMPLANT
TOWEL OR 17X24 6PK STRL BLUE (TOWEL DISPOSABLE) IMPLANT
TOWEL OR 17X26 10 PK STRL BLUE (TOWEL DISPOSABLE) ×3 IMPLANT
TRAY FOLEY CATH 14FRSI W/METER (CATHETERS) IMPLANT
TRAY LAPAROSCOPIC MC (CUSTOM PROCEDURE TRAY) ×3 IMPLANT
TROCAR XCEL BLUNT TIP 100MML (ENDOMECHANICALS) IMPLANT
TROCAR XCEL NON-BLD 11X100MML (ENDOMECHANICALS) ×3 IMPLANT
TROCAR XCEL NON-BLD 5MMX100MML (ENDOMECHANICALS) ×3 IMPLANT
TUBING INSUFFLATION (TUBING) ×3 IMPLANT
WATER STERILE IRR 1000ML POUR (IV SOLUTION) IMPLANT

## 2016-04-04 NOTE — Anesthesia Procedure Notes (Signed)
Procedure Name: Intubation Date/Time: 04/04/2016 7:34 AM Performed by: Lucinda DellECARLO, Elouise Divelbiss M Pre-anesthesia Checklist: Patient identified, Emergency Drugs available, Suction available and Patient being monitored Patient Re-evaluated:Patient Re-evaluated prior to inductionOxygen Delivery Method: Circle system utilized Preoxygenation: Pre-oxygenation with 100% oxygen Intubation Type: IV induction Ventilation: Mask ventilation without difficulty Laryngoscope Size: Mac and 3 Grade View: Grade II Tube type: Oral Tube size: 7.0 mm Number of attempts: 1 Airway Equipment and Method: Stylet Placement Confirmation: ETT inserted through vocal cords under direct vision,  positive ETCO2 and breath sounds checked- equal and bilateral Secured at: 21 cm Tube secured with: Tape Dental Injury: Teeth and Oropharynx as per pre-operative assessment

## 2016-04-04 NOTE — Op Note (Signed)
04/04/2016  8:39 AM  PATIENT:  Nicole Carr  45 y.o. female  PRE-OPERATIVE DIAGNOSIS:  EPIGASTRIC HERNIA  POST-OPERATIVE DIAGNOSIS:  EPIGASTRIC HERNIA  PROCEDURE:  Procedure(s): LAPAROSCOPIC EPIGASTRIC HERNIA REPAIR INSERTION OF MESH  SURGEON:  Violeta GelinasBurke Marigold Mom, M.D.  ASSISTANTS: Marca Anconaom Cornett, M.D.   ANESTHESIA:   local and general  EBL:  Total I/O In: 1000 [I.V.:1000] Out: -   BLOOD ADMINISTERED:none  DRAINS: none   SPECIMEN:  No Specimen  DISPOSITION OF SPECIMEN:  N/A  COUNTS:  YES  DICTATION: .Dragon Dictation Procedure in detail: [name] presents for laparoscopic repair of epigastric hernia with mesh. She was identified in the preop holding area. Informed consent was obtained. She received intravenous antibiotics. She was brought the operating room and general endotracheal anesthesia was administered by the anesthesia staff. Her abdomen was prepped and draped in a sterile fashion. We did time out procedure. An area was selected along the left costal margin for Optiview entry. Local was placed in a small incision was made. 5 mm Optiview port was then placed without difficulty. The abdomen was insufflated with carbon dioxide. Laparoscopic exploration revealed no complications from the Optiview insertion. Under direct vision, an 11 mm left lower quadrant port, a 5 mm right upper quadrant port, and a 5 mm right lower quadrant port were placed. Local was used at each port site. Exploration revealed, as expected, a primary epigastric hernia containing preperitoneal fat. The falciform ligament was excised from the area and taken back towards the liver using the harmonic scalpel. The segment of it was excised and removed. Next the preperitoneal fat in the hernia defect was all reduced. This was brought out through the left lower quadrant port site. Hernia defect was inspected and hemostasis was ensured. Next we selected a 15 cm round mesh. This would cover the defect with greater than 4  cm of overlap circumferentially. 0 Prolene sutures were placed at the 12:00, 3:00, 6:00, 9:00 positions of the mesh. The mesh was dipped in saline and then placed into the abdomen. It was oriented appropriately. Small incision was made in the skin over each suture and each limb of the suture was grabbed separately with an Endo Catch. Once all the sutures were brought out separately there were tied down securely bringing the mesh up against the anterior abdominal wall. 2 concentric rings of secure strap tacks were then placed. There was excellent hemostasis. 4 quadrant inspection revealed no common getting features. Ports removed under direct vision. Pneumoperitoneum was released. Wounds were irrigated and the port sites were closed with running 4-0 Vicryl. All wounds were then closed with Dermabond. All counts were correct. She tolerated the procedure well without apparent complications and was taken recovery in stable condition. PATIENT DISPOSITION:  PACU - hemodynamically stable.   Delay start of Pharmacological VTE agent (>24hrs) due to surgical blood loss or risk of bleeding:  no  Violeta GelinasBurke Benay Pomeroy, MD, MPH, FACS Pager: [phone]  12/4/20178:39 AM

## 2016-04-04 NOTE — Transfer of Care (Signed)
Immediate Anesthesia Transfer of Care Note  Patient: Nicole Carr  Procedure(s) Performed: Procedure(s): LAPAROSCOPIC EPIGASTRIC HERNIA REPAIR (N/A) INSERTION OF MESH (N/A)  Patient Location: PACU  Anesthesia Type:General  Level of Consciousness: awake, alert , oriented and patient cooperative  Airway & Oxygen Therapy: Patient Spontanous Breathing and Patient connected to nasal cannula oxygen  Post-op Assessment: Report given to RN, Post -op Vital signs reviewed and stable and Patient moving all extremities  Post vital signs: Reviewed and stable  Last Vitals:  Vitals:   04/04/16 0629 04/04/16 0848  BP: 114/71   Pulse: 78   Resp: 20   Temp: 36.7 C 36.3 C    Last Pain:  Vitals:   04/04/16 0629  TempSrc: Oral      Patients Stated Pain Goal: 3 (04/04/16 0612)  Complications: No apparent anesthesia complications

## 2016-04-04 NOTE — Interval H&P Note (Signed)
History and Physical Interval Note:  04/04/2016 6:54 AM  Nicole NationsAngela D Ledo  has presented today for surgery, with the diagnosis of EPIGASTRIC HERNIA  The various methods of treatment have been discussed with the patient and family. After consideration of risks, benefits and other options for treatment, the patient has consented to  Procedure(s): LAPAROSCOPIC EPIGASTRIC HERNIA REPAIR WITH MESH (N/A) INSERTION OF MESH (N/A) as a surgical intervention .  The patient's history has been reviewed, patient examined, no change in status, stable for surgery.  I have reviewed the patient's chart and labs.  Questions were answered to the patient's satisfaction.     Shelina Luo E

## 2016-04-04 NOTE — H&P (Signed)
Nicole NationsAngela D. Carr 02/10/2016 9:34 AM [facility] Patient #: [id number] DOB: 05/14/1970 Married / Language: English / Race: White Female   History of Present Illness [name]([name]; 02/10/2016 9:46 AM) The patient is a 45 year old female who presents for an evaluation of a hernia. I originally saw Ms. Droll in 2014 for an epigastric hernia. At that time, I recommended laparoscopic repair with mesh. She held off on scheduling. Since then, it is, little bit larger she reports. She also has some intermittent discomfort in the area though no severe pain. She has been eating and moving her bowels okay and she feels bloated at times. She returns, interested in reconsidering hernia repair.   Other Problems [name]([name], [name]; 02/10/2016 9:34 AM) High blood pressure Ventral Hernia Repair  Diagnostic Studies History [name]([name], [name]; 02/10/2016 9:34 AM) Colonoscopy never Mammogram within last year Pap Smear 1-5 years ago  Allergies [name]([name], [name]; 02/10/2016 9:35 AM) No Known Drug Allergies10/03/2016  Medication History ([name], [name]; 02/10/2016 9:35 AM) LORazepam (0.5MG  Tablet, Oral as needed) Active. Lisinopril (5MG  Tablet, Oral) Active. Vitamin D (Cholecalciferol) (1000UNIT Capsule, Oral) Active. Medications Reconciled  Social History [name]([name], [name]; 02/10/2016 9:34 AM) Alcohol use Occasional alcohol use. Caffeine use Coffee, Tea. No drug use Tobacco use Former smoker.  Family History [name]([name], [name]; 02/10/2016 9:34 AM) Arthritis Mother. Breast Cancer Sister. Heart Disease Father. Heart disease in female family member before age 46 Heart disease in female family member before age 46 Hypertension Father. Melanoma Father. Respiratory Condition Mother.  Pregnancy / Birth History [name]([name], [name]; 02/10/2016 9:34 AM) Age at menarche 12 years. Gravida 2 Length (months) of breastfeeding 3-6 Maternal age  46-25 Para 2 Regular periods    Review of Systems [name]([name] [name]; 02/10/2016 9:34 AM) General Not Present- Appetite Loss, Chills, Fatigue, Fever, Night Sweats, Weight Gain and Weight Loss. Skin Not Present- Change in Wart/Mole, Dryness, Hives, Jaundice, New Lesions, Non-Healing Wounds, Rash and Ulcer. HEENT Not Present- Earache, Hearing Loss, Hoarseness, Nose Bleed, Oral Ulcers, Ringing in the Ears, Seasonal Allergies, Sinus Pain, Sore Throat, Visual Disturbances, Wears glasses/contact lenses and Yellow Eyes. Respiratory Not Present- Bloody sputum, Chronic Cough, Difficulty Breathing, Snoring and Wheezing. Breast Not Present- Breast Mass, Breast Pain, Nipple Discharge and Skin Changes. Cardiovascular Not Present- Chest Pain, Difficulty Breathing Lying Down, Leg Cramps, Palpitations, Rapid Heart Rate, Shortness of Breath and Swelling of Extremities. Gastrointestinal Not Present- Abdominal Pain, Bloating, Bloody Stool, Change in Bowel Habits, Chronic diarrhea, Constipation, Difficulty Swallowing, Excessive gas, Gets full quickly at meals, Hemorrhoids, Indigestion, Nausea, Rectal Pain and Vomiting. Female Genitourinary Not Present- Frequency, Nocturia, Painful Urination, Pelvic Pain and Urgency. Musculoskeletal Not Present- Back Pain, Joint Pain, Joint Stiffness, Muscle Pain, Muscle Weakness and Swelling of Extremities. Neurological Not Present- Decreased Memory, Fainting, Headaches, Numbness, Seizures, Tingling, Tremor, Trouble walking and Weakness. Psychiatric Not Present- Anxiety, Bipolar, Change in Sleep Pattern, Depression, Fearful and Frequent crying. Endocrine Not Present- Cold Intolerance, Excessive Hunger, Hair Changes, Heat Intolerance, Hot flashes and New Diabetes. Hematology Not Present- Blood Thinners, Easy Bruising, Excessive bleeding, Gland problems, HIV and Persistent Infections.  Vitals ([name] [name]; 02/10/2016 9:35 AM) 02/10/2016 9:35 AM Weight: 198 lb Height:  63in Body Surface Area: 1.93 m Body Mass Index: 35.07 kg/m  Temp.: 73F(Temporal)  Pulse: 75 (Regular)  BP: 124/80 (Sitting, Left Arm, Standard)       Physical Exam [name]([name]; 02/10/2016 9:48 AM) General Mental Status-Alert. General Appearance-Consistent with stated age.  Hydration-Well hydrated. Voice-Normal.  Head and Neck Head-normocephalic, atraumatic with no lesions or palpable masses. Trachea-midline. Thyroid Gland Characteristics - normal size and consistency.  Eye Eyeball - Bilateral-Extraocular movements intact. Sclera/Conjunctiva - Bilateral-No scleral icterus.  Chest and Lung Exam Chest and lung exam reveals -quiet, even and easy respiratory effort with no use of accessory muscles and on auscultation, normal breath sounds, no adventitious sounds and normal vocal resonance. Inspection Chest Wall - Normal. Back - normal.  Cardiovascular Cardiovascular examination reveals -normal heart sounds, regular rate and rhythm with no murmurs and normal pedal pulses bilaterally.  Abdomen Inspection Hernias - Ventral - Reducible(A few centimeters cephalad of her umbilicus is an epigastric hernia which is almost completely reducible without pain). Palpation/Percussion Palpation and Percussion of the abdomen reveal - Soft, Non Tender, No Rebound tenderness, No Rigidity (guarding) and No hepatosplenomegaly. Auscultation Auscultation of the abdomen reveals - Bowel sounds normal.  Neurologic Neurologic evaluation reveals -alert and oriented x 3 with no impairment of recent or remote memory. Mental Status-Normal.  Musculoskeletal Global Assessment -Note: no gross deformities.  Normal Exam - Left-Upper Extremity Strength Normal and Lower Extremity Strength Normal. Normal Exam - Right-Upper Extremity Strength Normal and Lower Extremity Strength Normal.  Lymphatic Head & Neck  General Head & Neck Lymphatics: Bilateral  - Description - Normal. Axillary  General Axillary Region: Bilateral - Description - Normal. Tenderness - Non Tender. Femoral & Inguinal  Generalized Femoral & Inguinal Lymphatics: Bilateral - Description - No Generalized lymphadenopathy.    Assessment & Plan [name](Hayley Horn E. [name]; 02/10/2016 9:49 AM) EPIGASTRIC HERNIA (K43.9) Impression: I have offered laparoscopic repair of this epigastric hernia with mesh. Procedure, risks, and benefits were discussed in detail with her. I discussed the expected postoperative course and her need to avoid heavy lifting for 6 weeks postoperatively. She would like to schedule. I answered her questions.

## 2016-04-04 NOTE — Progress Notes (Signed)
Patient admitted to 6N room1 from PACU.  S/P hernia repair. Alert and oriented. Abdominal binder on. Reports better pain relief. Call bell in reach. Awaiting family to visit.

## 2016-04-05 ENCOUNTER — Encounter (HOSPITAL_COMMUNITY): Payer: Self-pay | Admitting: General Surgery

## 2016-04-05 DIAGNOSIS — K439 Ventral hernia without obstruction or gangrene: Secondary | ICD-10-CM | POA: Diagnosis not present

## 2016-04-05 MED ORDER — TRAMADOL HCL 50 MG PO TABS
50.0000 mg | ORAL_TABLET | Freq: Four times a day (QID) | ORAL | Status: DC
  Administered 2016-04-05 – 2016-04-06 (×4): 50 mg via ORAL
  Filled 2016-04-05 (×4): qty 1

## 2016-04-05 MED ORDER — OXYCODONE HCL 5 MG PO TABS
5.0000 mg | ORAL_TABLET | ORAL | Status: DC | PRN
  Administered 2016-04-05 (×3): 15 mg via ORAL
  Administered 2016-04-06: 10 mg via ORAL
  Filled 2016-04-05 (×3): qty 3
  Filled 2016-04-05: qty 2

## 2016-04-05 NOTE — Anesthesia Postprocedure Evaluation (Signed)
Anesthesia Post Note  Patient: Nicole Carr  Procedure(s) Performed: Procedure(s) (LRB): LAPAROSCOPIC EPIGASTRIC HERNIA REPAIR (N/A) INSERTION OF MESH (N/A)  Patient location during evaluation: PACU Anesthesia Type: General Level of consciousness: awake and alert Pain management: pain level controlled Vital Signs Assessment: post-procedure vital signs reviewed and stable Respiratory status: spontaneous breathing, nonlabored ventilation, respiratory function stable and patient connected to nasal cannula oxygen Cardiovascular status: blood pressure returned to baseline and stable Postop Assessment: no signs of nausea or vomiting Anesthetic complications: no    Last Vitals:  Vitals:   04/05/16 0211 04/05/16 0433  BP: 118/80 123/81  Pulse: 87 87  Resp: 17 17  Temp: 37.3 C 37.3 C    Last Pain:  Vitals:   04/05/16 0458  TempSrc:   PainSc: 9                  Shelton SilvasKevin D Keylin Podolsky

## 2016-04-05 NOTE — Progress Notes (Signed)
1 Day Post-Op  Subjective: Sore, no nausea, still requiring IV pain meds  Objective: Vital signs in last 24 hours: Temp:  [97.4 F (36.3 C)-99.2 F (37.3 C)] 99.1 F (37.3 C) (12/05 0433) Pulse Rate:  [69-98] 87 (12/05 0433) Resp:  [13-20] 17 (12/05 0433) BP: (117-140)/(76-98) 123/81 (12/05 0433) SpO2:  [94 %-100 %] 96 % (12/05 0433) Weight:  [93.8 kg (206 lb 12.7 oz)] 93.8 kg (206 lb 12.7 oz) (12/04 1043) Last BM Date: 04/03/16  Intake/Output from previous day: 12/04 0701 - 12/05 0700 In: 2702.5 [P.O.:660; I.V.:2042.5] Out: 1950 [Urine:1900; Blood:50] Intake/Output this shift: No intake/output data recorded.  GI: soft, incisions CDI  Lab Results:  No results for input(s): WBC, HGB, HCT, PLT in the last 72 hours. BMET No results for input(s): NA, K, CL, CO2, GLUCOSE, BUN, CREATININE, CALCIUM in the last 72 hours. PT/INR No results for input(s): LABPROT, INR in the last 72 hours. ABG No results for input(s): PHART, HCO3 in the last 72 hours.  Invalid input(s): PCO2, PO2  Studies/Results: No results found.  Anti-infectives: Anti-infectives    Start     Dose/Rate Route Frequency Ordered Stop   04/04/16 0558  ceFAZolin (ANCEF) IVPB 2g/100 mL premix     2 g 200 mL/hr over 30 Minutes Intravenous On call to O.R. 04/04/16 0558 04/04/16 0740      Assessment/Plan: s/p Procedure(s): LAPAROSCOPIC EPIGASTRIC HERNIA REPAIR (N/A) INSERTION OF MESH (N/A) POD#1 Schedule Ultram and increase oxy SL IV Check this PM for possible D/C VTE - Lovenox  LOS: 0 days    Nicole Carr E 04/05/2016

## 2016-04-06 DIAGNOSIS — K439 Ventral hernia without obstruction or gangrene: Secondary | ICD-10-CM | POA: Diagnosis not present

## 2016-04-06 MED ORDER — TRAMADOL HCL 50 MG PO TABS: 50.0000 mg | ORAL_TABLET | Freq: Four times a day (QID) | ORAL | 0 refills | Status: DC

## 2016-04-06 MED ORDER — OXYCODONE HCL 5 MG PO TABS: 5.0000 mg | ORAL_TABLET | ORAL | 0 refills | Status: DC | PRN

## 2016-04-06 NOTE — Progress Notes (Signed)
Patient discharged to home with instructions and prescription. 

## 2016-04-06 NOTE — Discharge Summary (Signed)
Physician Discharge Summary  Patient ID: Nicole Carr MRN: [id number] DOB/AGE: 46/08/1970 46 y.o.  Admit date: 04/04/2016 Discharge date: 04/06/2016  Admission Diagnoses:epigastric hernia  Discharge Diagnoses: S/P laparoscopic repair epigastric hernia with mesh Active Problems:   S/P laparoscopic hernia repair   Discharged Condition: good  Hospital Course: Underwent surgery and did well. POD#1 added Ultram to help with pain control. This was better by POD#2 and she is ready for D/C.  Consults: None  Significant Diagnostic Studies: none  Treatments: surgery  Discharge Exam: Blood pressure 124/79, pulse 97, temperature 98.3 F (36.8 C), temperature source Oral, resp. rate 18, height 5\' 4"  (1.626 m), weight 93.8 kg (206 lb 12.7 oz), last menstrual period 04/04/2016, SpO2 96 %. General appearance: cooperative Resp: clear to auscultation bilaterally GI: soft, incisions OK, binder  Disposition:   Discharge Instructions    Call MD for:  persistant nausea and vomiting    Complete by:  As directed    Call MD for:  redness, tenderness, or signs of infection (pain, swelling, redness, odor or green/yellow discharge around incision site)    Complete by:  As directed    Call MD for:  severe uncontrolled pain    Complete by:  As directed    Diet - low sodium heart healthy    Complete by:  As directed    Discharge wound care:    Complete by:  As directed    No dressings needed. Wear binder when up out of bed. You may shower.   Increase activity slowly    Complete by:  As directed        Medication List    TAKE these medications   CALCIUM-VITAMIN D PO Take 1 tablet by mouth daily.   ibuprofen 200 MG tablet Commonly known as:  ADVIL,MOTRIN Take 200 mg by mouth every 6 (six) hours as needed for headache or moderate pain.   lisinopril 5 MG tablet Commonly known as:  PRINIVIL,ZESTRIL Take 5 mg by mouth daily.   LORazepam 0.5 MG tablet Commonly known as:  ATIVAN Take 0.5  mg by mouth daily as needed for anxiety. Patient takes as needed   oxyCODONE 5 MG immediate release tablet Commonly known as:  Oxy IR/ROXICODONE Take 1-3 tablets (5-15 mg total) by mouth every 4 (four) hours as needed (5mg  for mild pain, 10mg  for moderate pain, 15mg  for severe pain).   traMADol 50 MG tablet Commonly known as:  ULTRAM Take 1 tablet (50 mg total) by mouth every 6 (six) hours.   Vitamin D3 5000 units Caps Take 5,000 Units by mouth daily.        SignedVioleta Gelinas: Yarelli Decelles E 04/06/2016, 7:58 AM

## 2017-01-16 ENCOUNTER — Other Ambulatory Visit: Payer: Self-pay | Admitting: Obstetrics and Gynecology

## 2017-01-16 DIAGNOSIS — Z1231 Encounter for screening mammogram for malignant neoplasm of breast: Secondary | ICD-10-CM

## 2017-01-30 ENCOUNTER — Ambulatory Visit
Admission: RE | Admit: 2017-01-30 | Discharge: 2017-01-30 | Disposition: A | Discharge status: Home / Self Care | Payer: BLUE CROSS/BLUE SHIELD | Source: Ambulatory Visit | Attending: Obstetrics and Gynecology | Admitting: Obstetrics and Gynecology

## 2017-01-30 DIAGNOSIS — Z1231 Encounter for screening mammogram for malignant neoplasm of breast: Secondary | ICD-10-CM

## 2017-09-18 DIAGNOSIS — N943 Premenstrual tension syndrome: Secondary | ICD-10-CM | POA: Diagnosis not present

## 2017-09-18 DIAGNOSIS — Z6835 Body mass index (BMI) 35.0-35.9, adult: Secondary | ICD-10-CM | POA: Diagnosis not present

## 2017-09-18 DIAGNOSIS — I10 Essential (primary) hypertension: Secondary | ICD-10-CM | POA: Diagnosis not present

## 2017-10-25 DIAGNOSIS — L259 Unspecified contact dermatitis, unspecified cause: Secondary | ICD-10-CM | POA: Diagnosis not present

## 2017-12-04 DIAGNOSIS — N3001 Acute cystitis with hematuria: Secondary | ICD-10-CM | POA: Diagnosis not present

## 2017-12-04 DIAGNOSIS — R300 Dysuria: Secondary | ICD-10-CM | POA: Diagnosis not present

## 2018-02-12 ENCOUNTER — Other Ambulatory Visit: Payer: Self-pay | Admitting: Obstetrics and Gynecology

## 2018-02-12 DIAGNOSIS — Z1231 Encounter for screening mammogram for malignant neoplasm of breast: Secondary | ICD-10-CM

## 2018-03-19 ENCOUNTER — Ambulatory Visit
Admission: RE | Admit: 2018-03-19 | Discharge: 2018-03-19 | Disposition: A | Discharge status: Home / Self Care | Payer: BLUE CROSS/BLUE SHIELD | Source: Ambulatory Visit | Attending: Obstetrics and Gynecology | Admitting: Obstetrics and Gynecology

## 2018-03-19 DIAGNOSIS — Z124 Encounter for screening for malignant neoplasm of cervix: Secondary | ICD-10-CM | POA: Diagnosis not present

## 2018-03-19 DIAGNOSIS — Z1389 Encounter for screening for other disorder: Secondary | ICD-10-CM | POA: Diagnosis not present

## 2018-03-19 DIAGNOSIS — Z130 Encounter for screening for diseases of the blood and blood-forming organs and certain disorders involving the immune mechanism: Secondary | ICD-10-CM | POA: Diagnosis not present

## 2018-03-19 DIAGNOSIS — Z01419 Encounter for gynecological examination (general) (routine) without abnormal findings: Secondary | ICD-10-CM | POA: Diagnosis not present

## 2018-03-19 DIAGNOSIS — Z1231 Encounter for screening mammogram for malignant neoplasm of breast: Secondary | ICD-10-CM | POA: Diagnosis not present

## 2018-03-20 DIAGNOSIS — Z124 Encounter for screening for malignant neoplasm of cervix: Secondary | ICD-10-CM | POA: Diagnosis not present

## 2018-03-20 DIAGNOSIS — Z01419 Encounter for gynecological examination (general) (routine) without abnormal findings: Secondary | ICD-10-CM | POA: Diagnosis not present

## 2018-04-09 DIAGNOSIS — Z23 Encounter for immunization: Secondary | ICD-10-CM | POA: Diagnosis not present

## 2018-04-09 DIAGNOSIS — N943 Premenstrual tension syndrome: Secondary | ICD-10-CM | POA: Diagnosis not present

## 2018-04-09 DIAGNOSIS — I10 Essential (primary) hypertension: Secondary | ICD-10-CM | POA: Diagnosis not present

## 2018-04-09 DIAGNOSIS — E559 Vitamin D deficiency, unspecified: Secondary | ICD-10-CM | POA: Diagnosis not present

## 2018-05-28 DIAGNOSIS — R05 Cough: Secondary | ICD-10-CM | POA: Diagnosis not present

## 2018-05-28 DIAGNOSIS — Z23 Encounter for immunization: Secondary | ICD-10-CM | POA: Diagnosis not present

## 2018-05-28 DIAGNOSIS — I10 Essential (primary) hypertension: Secondary | ICD-10-CM | POA: Diagnosis not present

## 2018-05-28 DIAGNOSIS — Z0000 Encounter for general adult medical examination without abnormal findings: Secondary | ICD-10-CM | POA: Diagnosis not present

## 2018-11-26 DIAGNOSIS — N943 Premenstrual tension syndrome: Secondary | ICD-10-CM | POA: Diagnosis not present

## 2018-11-26 DIAGNOSIS — R6889 Other general symptoms and signs: Secondary | ICD-10-CM | POA: Diagnosis not present

## 2018-11-26 DIAGNOSIS — I10 Essential (primary) hypertension: Secondary | ICD-10-CM | POA: Diagnosis not present

## 2019-01-01 DIAGNOSIS — D2230 Melanocytic nevi of unspecified part of face: Secondary | ICD-10-CM | POA: Diagnosis not present

## 2019-01-01 DIAGNOSIS — D225 Melanocytic nevi of trunk: Secondary | ICD-10-CM | POA: Diagnosis not present

## 2019-01-01 DIAGNOSIS — L814 Other melanin hyperpigmentation: Secondary | ICD-10-CM | POA: Diagnosis not present

## 2019-01-01 DIAGNOSIS — L719 Rosacea, unspecified: Secondary | ICD-10-CM | POA: Diagnosis not present

## 2019-05-13 ENCOUNTER — Other Ambulatory Visit: Payer: Self-pay | Admitting: Obstetrics and Gynecology

## 2019-05-13 DIAGNOSIS — Z1231 Encounter for screening mammogram for malignant neoplasm of breast: Secondary | ICD-10-CM

## 2019-06-24 ENCOUNTER — Other Ambulatory Visit: Payer: Self-pay

## 2019-06-24 ENCOUNTER — Ambulatory Visit
Admission: RE | Admit: 2019-06-24 | Discharge: 2019-06-24 | Disposition: A | Discharge status: Home / Self Care | Payer: Commercial Managed Care - PPO | Source: Ambulatory Visit | Attending: Obstetrics and Gynecology | Admitting: Obstetrics and Gynecology

## 2019-06-24 DIAGNOSIS — Z1231 Encounter for screening mammogram for malignant neoplasm of breast: Secondary | ICD-10-CM

## 2020-06-26 ENCOUNTER — Other Ambulatory Visit: Payer: Self-pay | Admitting: Obstetrics and Gynecology

## 2020-06-26 DIAGNOSIS — Z0000 Encounter for general adult medical examination without abnormal findings: Secondary | ICD-10-CM

## 2020-08-17 ENCOUNTER — Inpatient Hospital Stay: Admission: RE | Admit: 2020-08-17 | Payer: Commercial Managed Care - PPO | Source: Ambulatory Visit

## 2020-12-16 ENCOUNTER — Other Ambulatory Visit: Payer: Self-pay | Admitting: Family Medicine

## 2020-12-16 DIAGNOSIS — R0789 Other chest pain: Secondary | ICD-10-CM

## 2021-01-18 ENCOUNTER — Ambulatory Visit
Admission: RE | Admit: 2021-01-18 | Discharge: 2021-01-18 | Disposition: A | Discharge status: Home / Self Care | Payer: No Typology Code available for payment source | Source: Ambulatory Visit | Attending: Family Medicine | Admitting: Family Medicine

## 2021-01-18 DIAGNOSIS — R0789 Other chest pain: Secondary | ICD-10-CM

## 2022-03-10 ENCOUNTER — Other Ambulatory Visit: Payer: Self-pay | Admitting: Family Medicine

## 2022-03-10 DIAGNOSIS — Z1231 Encounter for screening mammogram for malignant neoplasm of breast: Secondary | ICD-10-CM

## 2022-03-28 ENCOUNTER — Ambulatory Visit
Admission: RE | Admit: 2022-03-28 | Discharge: 2022-03-28 | Disposition: A | Discharge status: Home / Self Care | Payer: Self-pay | Source: Ambulatory Visit | Attending: Family Medicine | Admitting: Family Medicine

## 2022-03-28 DIAGNOSIS — Z1231 Encounter for screening mammogram for malignant neoplasm of breast: Secondary | ICD-10-CM

## 2022-03-30 ENCOUNTER — Other Ambulatory Visit: Payer: Self-pay | Admitting: Family Medicine

## 2022-03-30 DIAGNOSIS — R928 Other abnormal and inconclusive findings on diagnostic imaging of breast: Secondary | ICD-10-CM

## 2022-04-02 ENCOUNTER — Ambulatory Visit
Admission: RE | Admit: 2022-04-02 | Discharge: 2022-04-02 | Disposition: A | Discharge status: Home / Self Care | Payer: Self-pay | Source: Ambulatory Visit | Attending: Family Medicine | Admitting: Family Medicine

## 2022-04-02 ENCOUNTER — Ambulatory Visit
Admission: RE | Admit: 2022-04-02 | Discharge: 2022-04-02 | Disposition: A | Discharge status: Home / Self Care | Payer: Managed Care, Other (non HMO) | Source: Ambulatory Visit | Attending: Family Medicine | Admitting: Family Medicine

## 2022-04-02 DIAGNOSIS — R928 Other abnormal and inconclusive findings on diagnostic imaging of breast: Secondary | ICD-10-CM

## 2022-05-12 IMAGING — CT CT CARDIAC CORONARY ARTERY CALCIUM SCORE
3 series · 14 of 20 positions shown, 16 images · non-contrast
Comparison: None.

CLINICAL DATA: High cholesterol

EXAM:
CT CARDIAC CORONARY ARTERY CALCIUM SCORE
TECHNIQUE: Non-contrast imaging through the heart was performed using
prospective ECG gating. Image post processing was performed on an
independent workstation, allowing for quantitative analysis of the
heart and coronary arteries. Note that this exam targets the heart
and the chest was not imaged in its entirety.

[Series 2: calcium scoring 2.00 qr36 bestdiast 69% hrt calciu · axial · 0.34mm/px · z∈[+1685,+1753]mm · 4 of 58 slices shown]
[im 12/58  vessel]
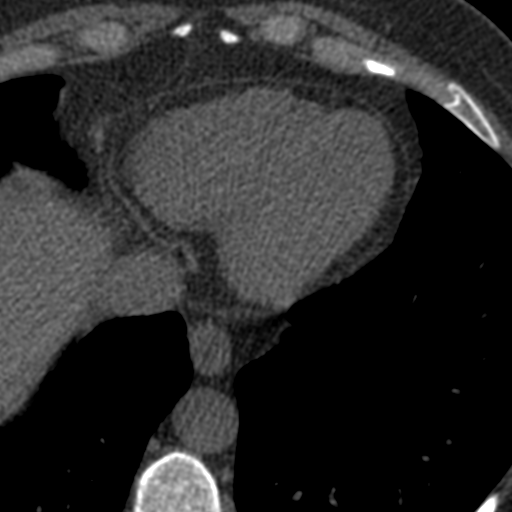
[im 23/58  vessel]
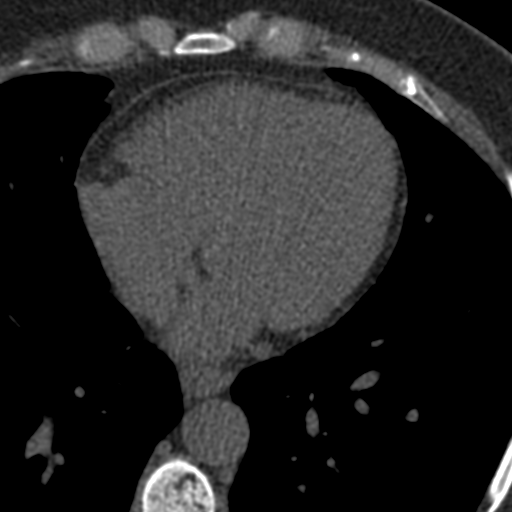
[im 35/58  vessel]
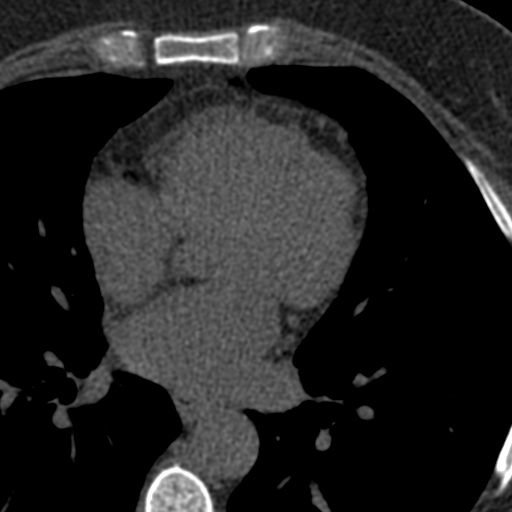
[im 46/58  vessel]
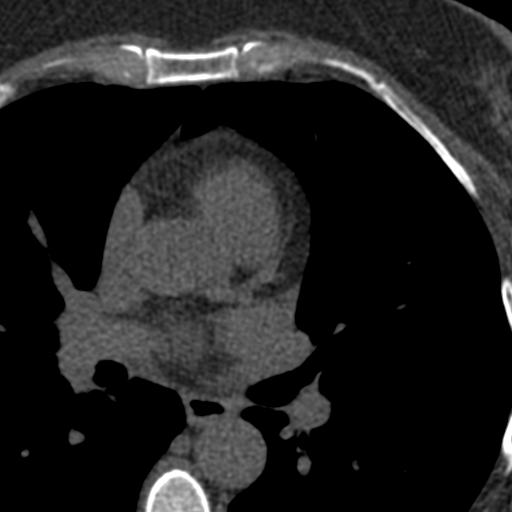

[Series 3: calcium scoring 2.00 br40 bestdiast 69% axial · axial · 0.60mm/px · z∈[+1681,+1757]mm · 5 of 58 slices shown, 7 images]
[im 10/58  vessel]
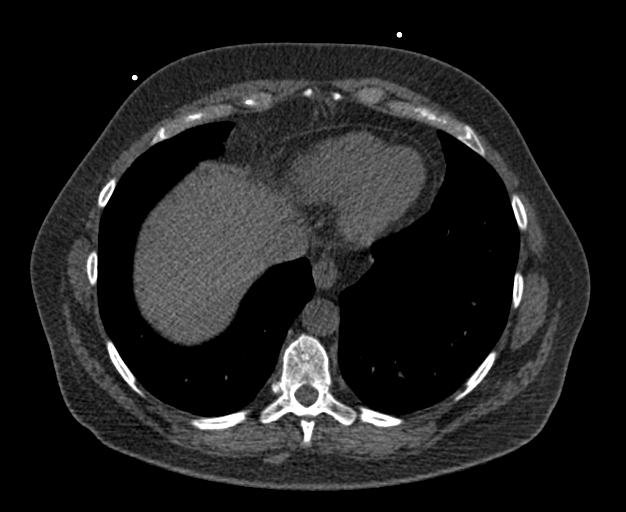
[im 10/58  lung]
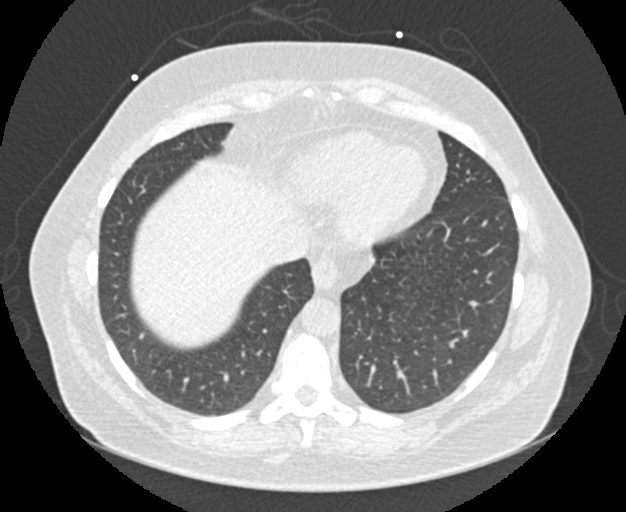
[im 20/58  vessel]
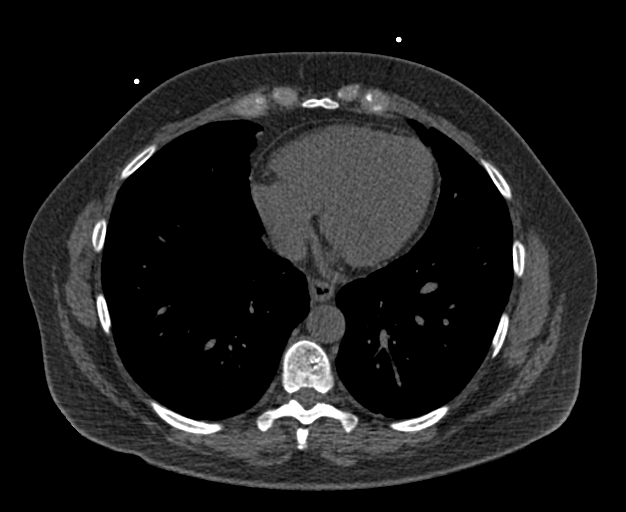
[im 29/58  vessel]
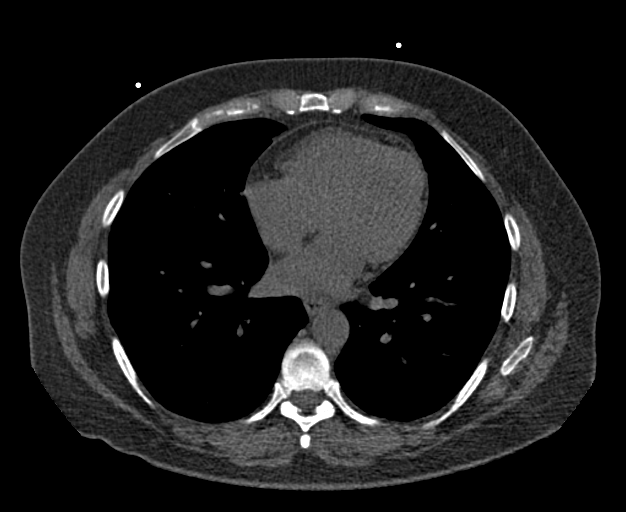
[im 39/58  vessel]
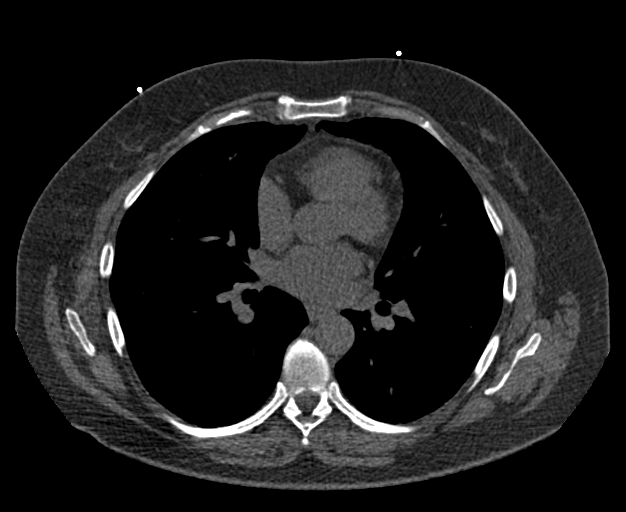
[im 48/58  vessel]
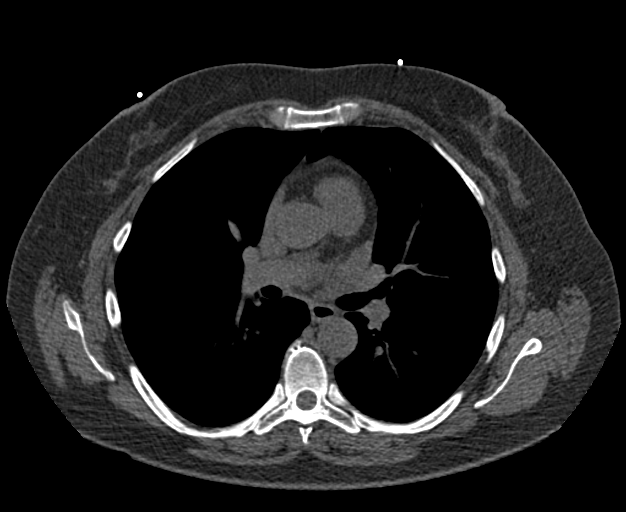
[im 48/58  lung]
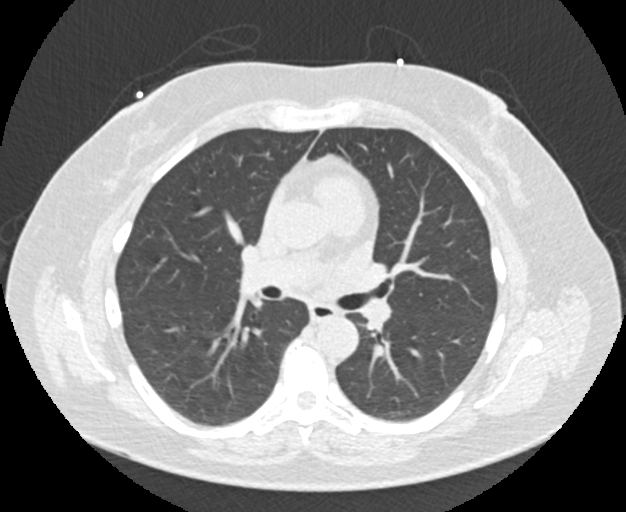

[Series 9: calcium scoring 2.00 br60 bestdiast 69% lungs · axial · 0.60mm/px · z∈[+1681,+1757]mm · 5 of 58 slices shown]
[im 10/58  vessel]
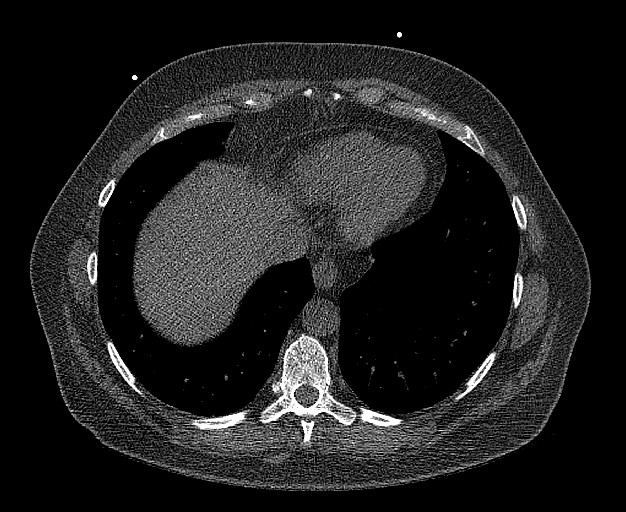
[im 20/58  vessel]
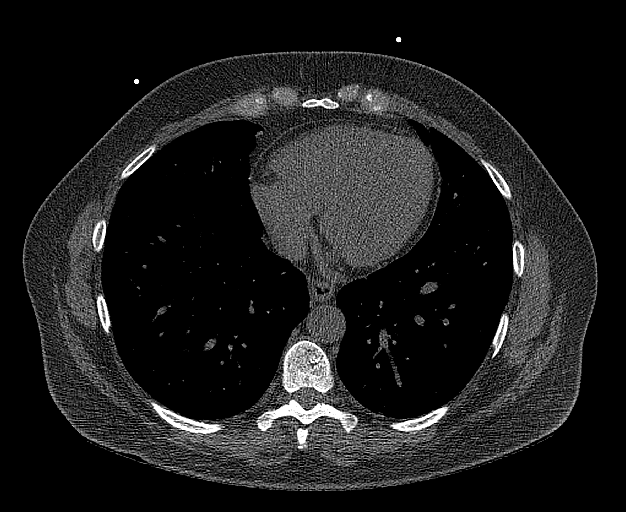
[im 29/58  vessel]
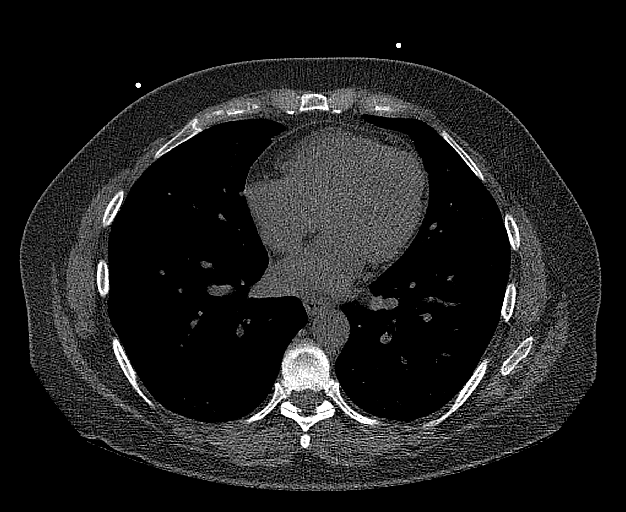
[im 39/58  vessel]
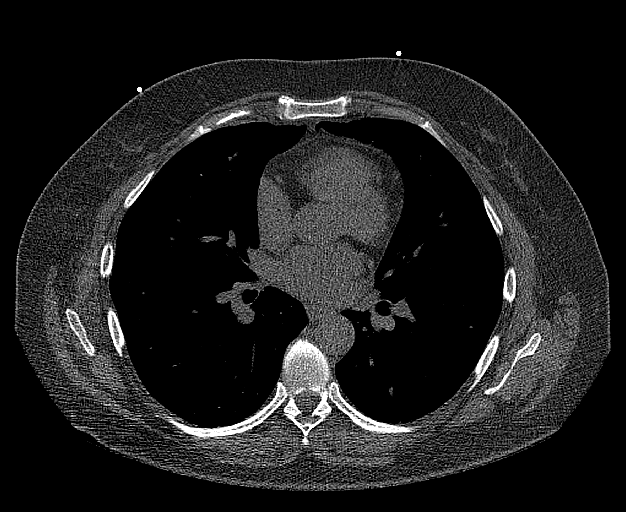
[im 48/58  vessel]
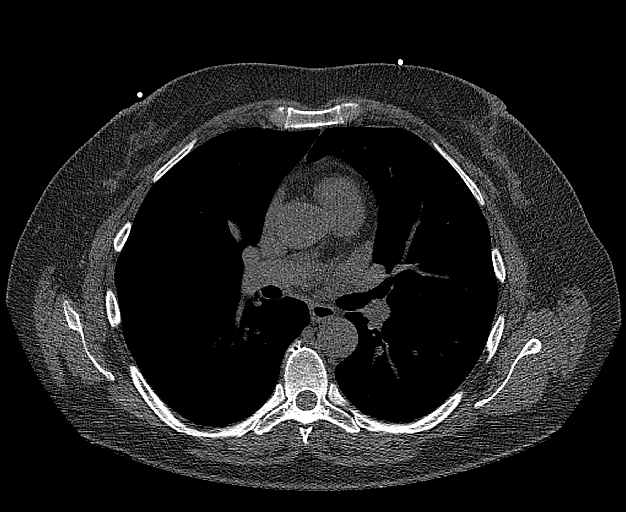

[14 of 20 positions shown; findings below may reference images not displayed]

FINDINGS: CORONARY CALCIUM SCORES:

Left Main: 0

LAD: 0

LCx: 0

RCA: 0

Total Agatston Score: 0

[HOSPITAL] percentile: 0

AORTA MEASUREMENTS:

Ascending Aorta: 33 mm

Descending Aorta: 25 mm

OTHER FINDINGS:

Heart is normal size. Aorta normal caliber. No adenopathy. No
confluent airspace opacities or effusions. Imaging into the upper
abdomen demonstrates no acute findings. Chest wall soft tissues are
unremarkable. No acute bony abnormality.
IMPRESSION: No visible coronary artery calcifications. Total coronary calcium
score of 0.

No acute or significant extracardiac abnormality.

## 2022-11-07 ENCOUNTER — Emergency Department (HOSPITAL_BASED_OUTPATIENT_CLINIC_OR_DEPARTMENT_OTHER): Payer: Managed Care, Other (non HMO)

## 2022-11-07 ENCOUNTER — Emergency Department (HOSPITAL_BASED_OUTPATIENT_CLINIC_OR_DEPARTMENT_OTHER)
Admission: EM | Admit: 2022-11-07 | Discharge: 2022-11-07 | Disposition: A | Discharge status: Home / Self Care | Payer: Managed Care, Other (non HMO) | Attending: Emergency Medicine | Admitting: Emergency Medicine

## 2022-11-07 ENCOUNTER — Other Ambulatory Visit: Payer: Self-pay

## 2022-11-07 DIAGNOSIS — S8002XA Contusion of left knee, initial encounter: Secondary | ICD-10-CM | POA: Insufficient documentation

## 2022-11-07 DIAGNOSIS — R55 Syncope and collapse: Secondary | ICD-10-CM | POA: Insufficient documentation

## 2022-11-07 DIAGNOSIS — Y92410 Unspecified street and highway as the place of occurrence of the external cause: Secondary | ICD-10-CM | POA: Diagnosis not present

## 2022-11-07 LAB — COMPREHENSIVE METABOLIC PANEL
ALT: 21 U/L (ref 0–44)
AST: 21 U/L (ref 15–41)
Albumin: 3.7 g/dL (ref 3.5–5.0)
Alkaline Phosphatase: 65 U/L (ref 38–126)
Anion gap: 10 (ref 5–15)
BUN: 21 mg/dL — ABNORMAL HIGH (ref 6–20)
CO2: 24 mmol/L (ref 22–32)
Calcium: 9 mg/dL (ref 8.9–10.3)
Chloride: 101 mmol/L (ref 98–111)
Creatinine, Ser: 0.93 mg/dL (ref 0.44–1.00)
GFR, Estimated: >60 mL/min (ref >60)
Glucose, Bld: 104 mg/dL — ABNORMAL HIGH (ref 70–99)
Potassium: 3.8 mmol/L (ref 3.5–5.1)
Sodium: 135 mmol/L (ref 135–145)
Total Bilirubin: 0.7 mg/dL (ref 0.3–1.2)
Total Protein: 7.1 g/dL (ref 6.5–8.1)

## 2022-11-07 LAB — CBC WITH DIFFERENTIAL/PLATELET
Abs Immature Granulocytes: 0.02 10*3/uL (ref 0.00–0.07)
Basophils Absolute: 0.1 10*3/uL (ref 0.0–0.1)
Basophils Relative: 1 %
Eosinophils Absolute: 0.1 10*3/uL (ref 0.0–0.5)
Eosinophils Relative: 1 %
HCT: 35.7 % — ABNORMAL LOW (ref 36.0–46.0)
Hemoglobin: 11.9 g/dL — ABNORMAL LOW (ref 12.0–15.0)
Immature Granulocytes: 0 %
Lymphocytes Relative: 14 %
Lymphs Abs: 1.3 10*3/uL (ref 0.7–4.0)
MCH: 31.4 pg (ref 26.0–34.0)
MCHC: 33.3 g/dL (ref 30.0–36.0)
MCV: 94.2 fL (ref 80.0–100.0)
Monocytes Absolute: 0.8 10*3/uL (ref 0.1–1.0)
Monocytes Relative: 8 %
Neutro Abs: 7.1 10*3/uL (ref 1.7–7.7)
Neutrophils Relative %: 76 %
Platelets: 236 10*3/uL (ref 150–400)
RBC: 3.79 MIL/uL — ABNORMAL LOW (ref 3.87–5.11)
RDW: 13 % (ref 11.5–15.5)
WBC: 9.4 10*3/uL (ref 4.0–10.5)
nRBC: 0 % (ref 0.0–0.2)

## 2022-11-07 LAB — URINALYSIS, ROUTINE W REFLEX MICROSCOPIC
Bilirubin Urine: NEGATIVE
Glucose, UA: NEGATIVE mg/dL
Ketones, ur: NEGATIVE mg/dL
Leukocytes,Ua: NEGATIVE
Nitrite: NEGATIVE
Protein, ur: NEGATIVE mg/dL
Specific Gravity, Urine: 1.01 (ref 1.005–1.030)
pH: 6.5 (ref 5.0–8.0)

## 2022-11-07 LAB — CBG MONITORING, ED: Glucose-Capillary: 108 mg/dL — ABNORMAL HIGH (ref 70–99)

## 2022-11-07 LAB — URINALYSIS, MICROSCOPIC (REFLEX)

## 2022-11-07 LAB — TROPONIN I (HIGH SENSITIVITY)
Troponin I (High Sensitivity): 5 ng/L (ref <18)
Troponin I (High Sensitivity): 4 ng/L (ref <18)

## 2022-11-07 MED ORDER — ACETAMINOPHEN 500 MG PO TABS
1000.0000 mg | ORAL_TABLET | Freq: Once | ORAL | Status: AC
  Administered 2022-11-07: 1000 mg via ORAL
  Filled 2022-11-07: qty 2

## 2022-11-07 NOTE — ED Notes (Signed)
New Order for troponin called to lab
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
## 2022-11-07 NOTE — ED Notes (Addendum)
Wrong chart

## 2022-11-21 ENCOUNTER — Telehealth: Payer: Self-pay | Admitting: Neurology

## 2022-11-21 ENCOUNTER — Ambulatory Visit (INDEPENDENT_AMBULATORY_CARE_PROVIDER_SITE_OTHER): Payer: Managed Care, Other (non HMO) | Admitting: Neurology

## 2022-11-21 ENCOUNTER — Encounter: Payer: Self-pay | Admitting: Neurology

## 2022-11-21 VITALS — BP 111/72 | HR 62 | Ht 63.0 in | Wt 162.4 lb

## 2022-11-21 DIAGNOSIS — R55 Syncope and collapse: Secondary | ICD-10-CM

## 2022-11-21 NOTE — Patient Instructions (Addendum)
Dizziness Dizziness is a common problem. It makes you feel unsteady or light-headed. You may feel like you're about to faint. Dizziness can lead to getting hurt if you stumble or fall. It's more common to feel dizzy if you're an older adult. Many things can cause you to feel dizzy. These include: Medicines. Dehydration. This is when there's not enough water in your body. Illness. Follow these instructions at home: Eating and drinking  Drink enough fluid to keep your pee (urine) pale yellow. This helps keep you from getting dehydrated. Try to drink more clear fluids, such as water. Do not drink alcohol. Try to limit how much caffeine you take in. Try to limit how much salt, also called sodium, you take in. Activity Try not to make quick movements. Stand up slowly from sitting in a chair. Steady yourself until you feel okay. In the morning, first sit up on the side of the bed. When you feel okay, hold onto something and slowly stand up. Do this until you know that your balance is okay. If you need to stand in one place for a long time, move your legs often. Tighten and relax the muscles in your legs while you're standing. Do not drive or use machines if you feel dizzy. Avoid bending down if you feel dizzy. Place items in your home so you can reach them without leaning over. Lifestyle Do not smoke, vape, or use products with nicotine or tobacco in them. If you need help quitting, talk with your health care provider. Try to lower your stress level. You can do this by using methods like yoga or meditation. Talk with your provider if you need help. General instructions Watch your dizziness for any changes. Take your medicines only as told by your provider. Talk with your provider if you think you're dizzy because of a medicine you're taking. Tell a friend or a family member that you're feeling dizzy. If they spot any changes in your behavior, have them call your provider. Contact a health care  provider if: Your dizziness doesn't go away, or you have new symptoms. Your dizziness gets worse. You feel like you may vomit. You have trouble hearing. You have a fever. You have neck pain or a stiff neck. You fall or get hurt. Get help right away if: You vomit each time you eat or drink. You have watery poop and can't eat or drink. You have trouble talking, walking, swallowing, or using your arms, hands, or legs. You feel very weak. You're bleeding. You're not thinking clearly, or you have trouble forming sentences. A friend or family member may spot this. Your vision changes, or you get a very bad headache. These symptoms may be an emergency. Call 911 right away. Do not wait to see if the symptoms will go away. Do not drive yourself to the hospital. This information is not intended to replace advice given to you by your health care provider. Make sure you discuss any questions you have with your health care provider. Document Revised: 06/02/2022 Document Reviewed: 06/02/2022 Elsevier Patient Education  2024 [employer]     Total time for face to face interview and examination, for review of  images and laboratory testing, neurophysiology testing and pre-existing records, including out-of -network , was 45 minutes. Assessment is as follows here:  1)   Sudden loss of awareness for 20 sec-40 sec / no incontinence,no convulsion, no postictal confusion. 2)  tunnel vision is atypical  for seizures.  Rather cardiac.  Plan:  Treatment plan and additional workup planned after today includes:   1)  EEG with  hyperventilation and strobe lights, 2)   remain off Wellbutrin/ bupropion and stay in your SSRI.  3) MRI brain for cerebellum/ foramen magnum evaluation.  4) Weight loss clinic has prescribed herbal supplements , Lyna Poser, __stay off herbals.   No driving until EEG, MRI have been back and normal.  Cardiology will need to follow up.   Melvyn Novas, MD
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
## 2022-11-21 NOTE — Telephone Encounter (Signed)
sent to GI they obtain [employer] auth [phone] 

## 2022-11-28 ENCOUNTER — Other Ambulatory Visit: Payer: Managed Care, Other (non HMO)

## 2022-11-29 ENCOUNTER — Ambulatory Visit (INDEPENDENT_AMBULATORY_CARE_PROVIDER_SITE_OTHER): Payer: Managed Care, Other (non HMO) | Admitting: Neurology

## 2022-11-29 DIAGNOSIS — R55 Syncope and collapse: Secondary | ICD-10-CM | POA: Diagnosis not present

## 2022-11-29 NOTE — Procedures (Signed)
    History:  52 year old man with loss of consciousness  EEG classification: Awake and drowsy  Description of the recording: The background rhythms of this recording consists of a fairly well modulated medium amplitude alpha rhythm of 11 Hz that is reactive to eye opening and closure. Present in the anterior head region is a 15-20 Hz beta activity. Photic stimulation was performed, did not show any abnormalities. Hyperventilation was also performed, did not show any abnormalities. Drowsiness was manifested by background fragmentation. No abnormal epileptiform discharges seen during this recording. There was no focal slowing. There were no electrographic seizure identified.   Abnormality: None   Impression: This is a normal EEG recorded while drowsy and awake. No evidence of interictal epileptiform discharges. Normal EEGs, however, do not rule out epilepsy.    Windell Norfolk, MD [facility]

## 2022-11-30 ENCOUNTER — Other Ambulatory Visit: Payer: Managed Care, Other (non HMO)

## 2022-12-05 ENCOUNTER — Other Ambulatory Visit: Payer: Managed Care, Other (non HMO)

## 2022-12-05 ENCOUNTER — Other Ambulatory Visit: Payer: Managed Care, Other (non HMO) | Admitting: *Deleted

## 2022-12-08 ENCOUNTER — Ambulatory Visit: Payer: Managed Care, Other (non HMO) | Admitting: Cardiology

## 2022-12-08 ENCOUNTER — Encounter: Payer: Self-pay | Admitting: Cardiology

## 2022-12-08 ENCOUNTER — Other Ambulatory Visit: Payer: Managed Care, Other (non HMO)

## 2022-12-08 VITALS — BP 133/81 | HR 66 | Resp 16 | Ht 63.0 in | Wt 162.0 lb

## 2022-12-08 DIAGNOSIS — E782 Mixed hyperlipidemia: Secondary | ICD-10-CM

## 2022-12-08 DIAGNOSIS — I10 Essential (primary) hypertension: Secondary | ICD-10-CM

## 2022-12-08 DIAGNOSIS — R55 Syncope and collapse: Secondary | ICD-10-CM

## 2022-12-08 DIAGNOSIS — G4730 Sleep apnea, unspecified: Secondary | ICD-10-CM

## 2022-12-08 NOTE — Telephone Encounter (Signed)
If the procedure is not emergent would proceed with echocardiogram and GXT first.  Tessa Lerner, DO, [facility]

## 2022-12-08 NOTE — Telephone Encounter (Signed)
From patient.
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
## 2022-12-16 ENCOUNTER — Ambulatory Visit
Admission: RE | Admit: 2022-12-16 | Discharge: 2022-12-16 | Disposition: A | Discharge status: Home / Self Care | Payer: Managed Care, Other (non HMO) | Source: Ambulatory Visit | Attending: Neurology | Admitting: Neurology

## 2022-12-16 ENCOUNTER — Other Ambulatory Visit: Payer: Managed Care, Other (non HMO)

## 2022-12-16 DIAGNOSIS — R55 Syncope and collapse: Secondary | ICD-10-CM

## 2022-12-19 ENCOUNTER — Encounter: Payer: Self-pay | Admitting: Neurology

## 2023-01-05 ENCOUNTER — Ambulatory Visit: Payer: Managed Care, Other (non HMO)

## 2023-01-05 DIAGNOSIS — R55 Syncope and collapse: Secondary | ICD-10-CM

## 2023-01-23 ENCOUNTER — Ambulatory Visit: Payer: Managed Care, Other (non HMO) | Admitting: Cardiology

## 2023-01-30 ENCOUNTER — Encounter: Payer: Self-pay | Admitting: Cardiology

## 2023-01-30 ENCOUNTER — Encounter: Payer: Self-pay | Admitting: Neurology

## 2023-02-01 ENCOUNTER — Encounter: Payer: Self-pay | Admitting: Cardiology

## 2023-02-01 ENCOUNTER — Telehealth: Payer: Self-pay | Admitting: *Deleted

## 2023-02-01 ENCOUNTER — Ambulatory Visit: Payer: Managed Care, Other (non HMO) | Attending: Cardiology | Admitting: Cardiology

## 2023-02-01 VITALS — BP 122/86 | HR 74 | Resp 16 | Ht 63.0 in | Wt 160.2 lb

## 2023-02-01 DIAGNOSIS — G4730 Sleep apnea, unspecified: Secondary | ICD-10-CM

## 2023-02-01 DIAGNOSIS — I10 Essential (primary) hypertension: Secondary | ICD-10-CM | POA: Diagnosis not present

## 2023-02-01 DIAGNOSIS — E782 Mixed hyperlipidemia: Secondary | ICD-10-CM

## 2023-02-01 DIAGNOSIS — Z01810 Encounter for preprocedural cardiovascular examination: Secondary | ICD-10-CM | POA: Diagnosis not present

## 2023-02-01 DIAGNOSIS — R55 Syncope and collapse: Secondary | ICD-10-CM

## 2023-02-01 NOTE — Patient Instructions (Signed)
Medication Instructions:  Your physician recommends that you continue on your current medications as directed. Please refer to the Current Medication list given to you today. *If you need a refill on your cardiac medications before your next appointment, please call your pharmacy*   Lab Work: None.  If you have labs (blood work) drawn today and your tests are completely normal, you will receive your results only by: MyChart Message (if you have MyChart) OR A paper copy in the mail If you have any lab test that is abnormal or we need to change your treatment, we will call you to review the results.   Testing/Procedures: None.   Follow-Up: At [facility], you and your health needs are our priority.  As part of our continuing mission to provide you with exceptional heart care, we have created designated Provider Care Teams.  These Care Teams include your primary Cardiologist (physician) and Advanced Practice Providers (APPs -  Physician Assistants and Nurse Practitioners) who all work together to provide you with the care you need, when you need it.  We recommend signing up for the patient portal called "MyChart".  Sign up information is provided on this After Visit Summary.  MyChart is used to connect with patients for Virtual Visits (Telemedicine).  Patients are able to view lab/test results, encounter notes, upcoming appointments, etc.  Non-urgent messages can be sent to your provider as well.   To learn more about what you can do with MyChart, go to [email or web address].    Your next appointment will be as needed and it will be with:    Provider:   Tessa Lerner, DO

## 2023-02-01 NOTE — Telephone Encounter (Signed)
   Pre-operative Risk Assessment    Patient Name: Nicole Carr  DOB: [birth year] MRN: [id number]    DATE OF LAST VISIT: TODAY 02/01/23 DR. TOLIA DATE OF NEXT VISIT: NONE  Request for Surgical Clearance    Procedure:   HYSTEROSCOPY, D&C, MYOSURE MANUAL  Date of Surgery:  Clearance 02/13/23                                 Surgeon:  DR. TODD MEISINGER Surgeon's Group or Practice Name:  [facility] Phone number:  [phone] ATTN: Hal Morales Fax number:  [phone]   Type of Clearance Requested:   - Medical ; NO MEDICATIONS LISTED AS NEEDING TO BE HELD   Type of Anesthesia:  Not Indicated   Additional requests/questions:    Nicole Carr   02/01/2023, 5:07 PM

## 2023-02-01 NOTE — Progress Notes (Signed)
Cardiology Office Note:  .   Date:  02/01/2023  ID:  Nicole Carr, DOB 04/20/1971, MRN [id number] PCP:  Laurann Montana, MD  [facility] Providers Cardiologist:  Tessa Lerner, DO , [facility] (established care August 2024) Electrophysiologist:  None  Click to update primary MD,subspecialty MD or APP then REFRESH:1}    History of Present Illness: .   [name] is a 52 y.o. Caucasian female whose past medical history and cardiovascular risk factors includes: Former smoker, hyperlipidemia, hypertension, vitamin D deficiency, OSA not on CPAP, depression.  Patient was referred to the practice for evaluation of syncope.  Please refer to the initial consult from August 2024 events prior to her loss of consciousness.  At the last office visit that she had decision was to proceed with Zio patch to evaluate for dysrhythmias, GXT, and echocardiogram.  Results of these diagnostic tests reviewed with her in detail and noted below for further reference.  Since last office visit, no recurrence of syncope, chest pain or heart failure symptoms.  Patient is being considered for GYN procedure later this month given her uterine fibroids.  She is accompanied by her husband at today's office visit.   Review of Systems: .   Review of Systems  Cardiovascular:  Negative for chest pain, claudication, dyspnea on exertion, irregular heartbeat, leg swelling, near-syncope, orthopnea, palpitations, paroxysmal nocturnal dyspnea and syncope.  Respiratory:  Negative for shortness of breath.   Hematologic/Lymphatic: Negative for bleeding problem.  Musculoskeletal:  Negative for muscle cramps and myalgias.  Neurological:  Negative for dizziness and light-headedness.    Studies Reviewed:   EKG: December 08, 2022: Sinus rhythm, 65 bpm, normal axis, without underlying ischemia or injury pattern.  Echocardiogram: 01/05/2023: Normal LV systolic function with visual EF 60-65%. Left ventricle cavity is normal  in size. Normal left ventricular wall thickness. Normal global wall motion. Normal diastolic filling pattern, normal LAP.  Mild (Grade I) mitral regurgitation. No prior study for comparison.   Stress Testing: Exercise treadmill stress test 01/05/2023: Functional status: Good.  Chest pain: No.  Reason for stopping exercise: Fatigue/weakness.  Hypertensive response to exercise: No.  Exercise time 9 minutes 11 seconds on Bruce protocol, achieved 10.44 METS, 76 % of age-predicted maximum heart rate (APMHR).  Stress ECG non-diagnostic for ischemia as patient did not achieve 85% APMHR.  However, at the current workload stress ECG is low probability for ischemia.  Consider additional testing if clinical indicated.   Cardiac monitor (Zio Patch): Dominant rhythm sinus. Heart rate 52-125, bpm. Avg HR 73 bpm. No atrial fibrillation detected during the monitoring period. No supraventricular tachycardia, ventricular tachycardia, high grade AV block, pauses (3 seconds or longer). Total supraventricular ectopic burden <1%. Total ventricular ectopic burden <1%. Patient triggered events: 1. Underlying rhythm sinus.   CT Cardiac Scoring: 01/18/2021 Total CAC 0 AU. Noncardiac findings: No acute or significant extracardiac abnormality.  Risk Assessment/Calculations:    10-year risk of ASCVD 1.2%  Labs:       Latest Ref Rng & Units 11/07/2022    2:33 PM 03/28/2016    8:56 AM  CBC  WBC 4.0 - 10.5 K/uL 9.4  6.6   Hemoglobin 12.0 - 15.0 g/dL 40.9  81.1   Hematocrit 36.0 - 46.0 % 35.7  37.7   Platelets 150 - 400 K/uL 236  253        Latest Ref Rng & Units 11/07/2022    2:33 PM 03/28/2016    8:56 AM  BMP  Glucose 70 -  99 mg/dL 161  97   BUN 6 - 20 mg/dL 21  10   Creatinine 0.96 - 1.00 mg/dL 0.45  4.09   Sodium 811 - 145 mmol/L 135  137   Potassium 3.5 - 5.1 mmol/L 3.8  3.9   Chloride 98 - 111 mmol/L 101  105   CO2 22 - 32 mmol/L 24  26   Calcium 8.9 - 10.3 mg/dL 9.0  9.6       Latest  Ref Rng & Units 11/07/2022    2:33 PM 03/28/2016    8:56 AM  CMP  Glucose 70 - 99 mg/dL 914  97   BUN 6 - 20 mg/dL 21  10   Creatinine 7.82 - 1.00 mg/dL 9.56  2.13   Sodium 086 - 145 mmol/L 135  137   Potassium 3.5 - 5.1 mmol/L 3.8  3.9   Chloride 98 - 111 mmol/L 101  105   CO2 22 - 32 mmol/L 24  26   Calcium 8.9 - 10.3 mg/dL 9.0  9.6   Total Protein 6.5 - 8.1 g/dL 7.1    Total Bilirubin 0.3 - 1.2 mg/dL 0.7    Alkaline Phos 38 - 126 U/L 65    AST 15 - 41 U/L 21    ALT 0 - 44 U/L 21      No results found for: "CHOL", "HDL", "LDLCALC", "LDLDIRECT", "TRIG", "CHOLHDL" No results for input(s): "LIPOA" in the last 8760 hours. No components found for: "NTPROBNP" No results for input(s): "PROBNP" in the last 8760 hours. No results for input(s): "TSH" in the last 8760 hours.  External Labs: Collected: October 25, 2021 from [facility] database. Total cholesterol 287, triglycerides 118, HDL 97, calculated LDL 169. TSH 2.9. BUN 21, creatinine 0.93.  Physical Exam:    Today's Vitals   02/01/23 0836  BP: 122/86  Pulse: 74  Resp: 16  SpO2: 98%  Weight: 160 lb 3.2 oz (72.7 kg)  Height: 5\' 3"  (1.6 m)   Body mass index is 28.38 kg/m. Wt Readings from Last 3 Encounters:  02/01/23 160 lb 3.2 oz (72.7 kg)  12/08/22 162 lb (73.5 kg)  11/21/22 162 lb 6.4 oz (73.7 kg)    Physical Exam  Constitutional: No distress.  Age appropriate, hemodynamically stable.   Neck: No JVD present.  Cardiovascular: Normal rate, regular rhythm, S1 normal, S2 normal, intact distal pulses and normal pulses. Exam reveals no gallop, no S3 and no S4.  No murmur heard. Pulmonary/Chest: Effort normal and breath sounds normal. No stridor. She has no wheezes. She has no rales.  Abdominal: Soft. Bowel sounds are normal. She exhibits no distension. There is no abdominal tenderness.  Musculoskeletal:        General: No edema.     Cervical back: Neck supple.  Neurological: She is alert and oriented to person, place, and time.  She has intact cranial nerves (2-12).  Skin: Skin is warm and moist.   Impression & Recommendation(s):  Impression:   ICD-10-CM   1. Syncope and collapse  R55     2. Preop cardiovascular exam  Z01.810     3. Benign hypertension  I10     4. Mixed hyperlipidemia  E78.2     5. Sleep apnea in adult  G47.30        Recommendation(s):  Syncope and collapse Index event November 07, 2022. Prior EKG nonischemic. Echocardiogram: Preserved LVEF, no significant valvular heart disease, see report for additional details. GXT: Low risk at current  workload but submaximal stress test.  Zio: Unremarkable. CAC score was 0 in the past.  She is aware of [place] driving laws to stop driving after an episode of loss of consciousness until 6 months event-free. Patient had outside forms to fill out from what appears to be DMV-they were filled out in her presence and originals provided back to the patient.  Preop cardiovascular exam Patient is being considered for GYN procedure with Dr. Newt Minion for removal of her uterine fibroids. Overall low risk for upcoming procedure.  Benign hypertension Office blood pressure is at goal. Currently not on hypertensive medications. Monitor for now. Re emphasize importance of a low-salt diet  Mixed hyperlipidemia Carries history of hyperlipidemia. Outside labs from June 2023 independently reviewed via [facility] database during today's office visit. Coronary calcium score is 0. Re emphasize importance of dietary restrictions of foods that are high in lipids.  Patient was noted to have mild mitral regurgitation on recent surface echocardiogram.  Likely an incidental finding and clinically asymptomatic.  Consider repeating a surface echocardiogram in 3 to 5 years to reevaluate disease progression.  Patient and husband verbalized understanding.  Orders Placed:  No orders of the defined types were placed in this encounter.  As part of medical decision making results  of the echo, Zio patch, GXT, outside labs reviewed [facility] database were reviewed independently at today's visit.   Final Medication List:   No orders of the defined types were placed in this encounter.   There are no discontinued medications.   Current Outpatient Medications:    Cholecalciferol (VITAMIN D3) 5000 units CAPS, Take 5,000 Units by mouth daily., Disp: , Rfl:    sertraline (ZOLOFT) 100 MG tablet, Take 100 mg by mouth daily., Disp: , Rfl:   Consent:    NA  Disposition:   Return if symptoms worsen or fail to improve. or sooner if needed.  Her questions and concerns were addressed to her satisfaction. She voices understanding of the recommendations provided during this encounter.    Signed, Tessa Lerner, DO, [facility]  [facility]  [address] [place], [place] [address] [phone] 02/01/2023 9:08 AM
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
## 2023-02-02 NOTE — Telephone Encounter (Signed)
Received a form that was completed for the patient and once MD signs will send over to her provider via fax

## 2023-02-02 NOTE — Telephone Encounter (Signed)
   Patient Name: Nicole Carr  DOB: 10-Apr-1971 MRN: [id number]  Primary Cardiologist: Tessa Lerner, DO  Chart reviewed as part of pre-operative protocol coverage. Given past medical history and time since last visit, based on ACC/AHA guidelines, Lilliah Priego Rembold is at acceptable risk for the planned procedure without further cardiovascular testing.   Saw Dr. Odis Hollingshead on 02/01/2023 "Preop cardiovascular exam Patient is being considered for GYN procedure with Dr. Newt Minion for removal of her uterine fibroids. Overall low risk for upcoming procedure."  The patient was advised that if she develops new symptoms prior to surgery to contact our office to arrange for a follow-up visit, and she verbalized understanding.  I will route this recommendation to the requesting party via Epic fax function and remove from pre-op pool.  Please call with questions.  Joni Reining, NP 02/02/2023, 7:43 AM

## 2023-02-03 NOTE — Telephone Encounter (Signed)
Thanks for the update.   Ezell Poke [place], DO, [facility]

## 2023-02-06 ENCOUNTER — Ambulatory Visit (INDEPENDENT_AMBULATORY_CARE_PROVIDER_SITE_OTHER): Payer: Managed Care, Other (non HMO) | Admitting: Adult Health

## 2023-02-06 ENCOUNTER — Encounter: Payer: Self-pay | Admitting: Adult Health

## 2023-02-06 VITALS — BP 138/84 | HR 63 | Ht 63.5 in | Wt 163.0 lb

## 2023-02-06 DIAGNOSIS — R55 Syncope and collapse: Secondary | ICD-10-CM

## 2023-02-06 NOTE — Patient Instructions (Addendum)
Your Plan:  No clear cause from a neurological stand point of your prior syncopal event - please call if this should reoccur   No further recommendations or work up indicated from our standpoint at this time  We will let you know once paperwork completed to pick up from office       Thank you for coming to see [place] at [facility]. I hope we have been able to provide you high quality care today.  You may receive a patient satisfaction survey over the next few weeks. We would appreciate your feedback and comments so that we may continue to improve ourselves and the health of our patients.

## 2023-02-07 ENCOUNTER — Telehealth: Payer: Self-pay

## 2023-02-07 NOTE — Telephone Encounter (Signed)
Form placed in MR for pick up

## 2023-02-07 NOTE — Telephone Encounter (Signed)
DMV forms completed, placed on NP desk for review and signature.

## 2023-02-07 NOTE — Telephone Encounter (Signed)
Completed and signed.  Thank you. 

## 2023-02-08 ENCOUNTER — Telehealth: Payer: Self-pay | Admitting: *Deleted
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
## 2023-05-02 ENCOUNTER — Encounter: Payer: Self-pay | Admitting: Neurology

## 2023-05-18 ENCOUNTER — Ambulatory Visit (INDEPENDENT_AMBULATORY_CARE_PROVIDER_SITE_OTHER): Payer: Managed Care, Other (non HMO) | Admitting: Neurology

## 2023-05-18 ENCOUNTER — Encounter: Payer: Self-pay | Admitting: Neurology

## 2023-05-18 VITALS — BP 127/79 | HR 92 | Ht 63.0 in | Wt 182.0 lb

## 2023-05-18 DIAGNOSIS — F1010 Alcohol abuse, uncomplicated: Secondary | ICD-10-CM | POA: Diagnosis not present

## 2023-05-18 DIAGNOSIS — R55 Syncope and collapse: Secondary | ICD-10-CM

## 2023-05-18 MED ORDER — NEPHRO-VITE RX 1 MG PO TABS: 1.0000 | ORAL_TABLET | Freq: Every day | ORAL | 5 refills | Status: DC
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
## 2023-05-18 NOTE — Patient Instructions (Signed)
Syncope You will learn about conditions that cause people to pass out, including how those conditions are diagnosed and treated. To view the content, go to this web address: [email or web address].[email or web address]  This video will expire on: 10/23/2024. If you need access to this video following this date, please reach out to the healthcare provider who assigned it to you. This information is not intended to replace advice given to you by your health care provider. Make sure you discuss any questions you have with your health care provider. Elsevier Patient Education  2024 [employer] Near-Syncope Near-syncope is when you suddenly feel like you might pass out or faint. This may also be called presyncope. During an episode of near-syncope, you may: Feel dizzy, weak, or light-headed. It may feel like the room is spinning. Feel like you may vomit (nauseous). See spots or see all white or all black. Have cold, clammy skin. Feel warm and sweaty. Hear ringing in your ears. This condition is caused by a sudden decrease in blood flow to the brain. This can result from many causes, but most of those causes are not dangerous. However, near-syncope may be a sign of a serious medical problem, so it is important to seek medical care. Follow these instructions at home: Medicines Take over-the-counter and prescription medicines only as told by your doctor. If you are taking blood pressure or heart medicine, get up slowly and spend many minutes getting ready to sit and then stand. This can help with dizziness. Lifestyle Do not drive, use machinery, or play sports until your doctor says it is okay. Do not drink alcohol. Do not smoke or use any products that contain nicotine or tobacco. If you need help quitting, ask your doctor. Avoid hot tubs and saunas. General instructions Be aware of any changes in your symptoms. Talk with your doctor about your symptoms. You may need to have testing to help find the  cause. If you start to feel like you might pass out, sit or lie down right away. If sitting, lower your head down between your legs. If lying down, raise (elevate) your feet above the level of your heart. Breathe deeply and steadily. Wait until all of the symptoms are gone. Have someone stay with you until you feel better. Drink enough fluid to keep your pee (urine) pale yellow. Avoid standing for a long time. If you must stand for a long time, do movements such as: Moving your legs. Crossing your legs. Flexing and stretching your leg muscles. Squatting. Keep all follow-up visits. Contact a doctor if: You continue to have episodes of near fainting. Get help right away if: You pass out or faint. You have any of these symptoms: Fast or uneven heartbeats (palpitations). Pain in your chest, belly, or back. Shortness of breath. You have a seizure. You have a very bad headache. You are confused. You have trouble seeing. You are very weak. You have trouble walking. You are bleeding from your mouth or butt. You have black or tarry poop (stool). These symptoms may be an emergency. Get help right away. Call your local emergency services (911 in the [place] Do not wait to see if the symptoms will go away. Do not drive yourself to the hospital. Summary Near-syncope is when you suddenly feel like you might pass out or faint. This condition is caused by a sudden decrease in blood flow to the brain. Near-syncope may be a sign of a serious medical problem, so it is important to seek medical care. If  you start to feel like you might pass out, sit or lie down right away. If sitting, lower your head down between your legs. If lying down, raise (elevate) your feet above the level of your heart. Talk with your doctor about your symptoms. You may need to have testing to help find the cause. This information is not intended to replace advice given to you by your health care provider. Make sure you  discuss any questions you have with your health care provider. Document Revised: 08/27/2020 Document Reviewed: 08/27/2020 Elsevier Patient Education  2024 [employer].

## 2023-05-19 LAB — ALP+ALT+AST+GGT+PROT+TBILI
ALT: 25 [IU]/L (ref 0–32)
AST: 26 [IU]/L (ref 0–40)
Alkaline Phosphatase: 77 [IU]/L (ref 44–121)
Bilirubin Total: 0.5 mg/dL (ref 0.0–1.2)
GGT: 16 [IU]/L (ref 0–60)
Total Protein: 6.9 g/dL (ref 6.0–8.5)

## 2023-05-22 ENCOUNTER — Encounter: Payer: Self-pay | Admitting: Neurology

## 2023-05-22 ENCOUNTER — Ambulatory Visit: Payer: Managed Care, Other (non HMO) | Attending: Cardiology | Admitting: Cardiology

## 2023-05-22 ENCOUNTER — Encounter: Payer: Self-pay | Admitting: Cardiology

## 2023-05-22 VITALS — BP 128/84 | HR 77 | Ht 63.0 in | Wt 183.0 lb

## 2023-05-22 DIAGNOSIS — R55 Syncope and collapse: Secondary | ICD-10-CM

## 2023-05-22 DIAGNOSIS — E782 Mixed hyperlipidemia: Secondary | ICD-10-CM | POA: Diagnosis not present
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
## 2023-05-22 NOTE — Patient Instructions (Signed)
Medication Instructions:  Your physician recommends that you continue on your current medications as directed. Please refer to the Current Medication list given to you today.  Labwork: None ordered.  Testing/Procedures: None ordered.  Follow-Up: You are scheduled to see Otilio Saber, PA on 06/01/2023 @ 8:20 am    Implantable Loop Recorder Placement, Care After This sheet gives you information about how to care for yourself after your procedure. Your health care provider may also give you more specific instructions. If you have problems or questions, contact your health care provider. What can I expect after the procedure? After the procedure, it is common to have: Soreness or discomfort near the incision. Some swelling or bruising near the incision.  Follow these instructions at home: Incision care  Monitor your cardiac device site for redness, swelling, and drainage. Call the device clinic at [phone] if you experience these symptoms or fever/chills.  Keep the large square bandage on your site for 24 hours and then you may remove it yourself. Keep the steri-strips underneath in place.   You may shower after 72 hours / 3 days from your procedure with the steri-strips in place. They will usually fall off on their own, or may be removed after 10 days. Pat dry.   Avoid lotions, ointments, or perfumes over your incision until it is well-healed.  Please do not submerge in water until your site is completely healed.   Your device is MRI compatible.   Remote monitoring is used to monitor your cardiac device from home. This monitoring is scheduled every month by our office. It allows [place] to keep an eye on the function of your device to ensure it is working properly.  If your wound site starts to bleed apply pressure.    For help with the monitor please call Medtronic Monitor Support Specialist directly at [phone].    If you have any questions/concerns please call the device  clinic at [phone].  Activity  Return to your normal activities.  General instructions Follow instructions from your health care provider about how to manage your implantable loop recorder and transmit the information. Learn how to activate a recording if this is necessary for your type of device. You may go through a metal detection gate, and you may let someone hold a metal detector over your chest. Show your ID card if needed. Do not have an MRI unless you check with your health care provider first. Take over-the-counter and prescription medicines only as told by your health care provider. Keep all follow-up visits as told by your health care provider. This is important. Contact a health care provider if: You have redness, swelling, or pain around your incision. You have a fever. You have pain that is not relieved by your pain medicine. You have triggered your device because of fainting (syncope) or because of a heartbeat that feels like it is racing, slow, fluttering, or skipping (palpitations). Get help right away if you have: Chest pain. Difficulty breathing. Summary After the procedure, it is common to have soreness or discomfort near the incision. Change your dressing as told by your health care provider. Follow instructions from your health care provider about how to manage your implantable loop recorder and transmit the information. Keep all follow-up visits as told by your health care provider. This is important. This information is not intended to replace advice given to you by your health care provider. Make sure you discuss any questions you have with your health care provider. Document Released:  03/30/2015 Document Revised: 06/03/2017 Document Reviewed: 06/03/2017 Elsevier Patient Education  2020 [employer].

## 2023-05-23 LAB — VITAMIN B1: Thiamine: 126.5 nmol/L (ref 66.5–200.0)

## 2023-05-31 NOTE — Progress Notes (Deleted)
  Electrophysiology Office Note:   Date:  05/31/2023  ID:  Nicole Carr, DOB 1970/10/19, MRN [id number]  Primary Cardiologist: Tessa Lerner, DO Electrophysiologist: Will Jorja Loa, MD  {Click to update primary MD,subspecialty MD or APP then REFRESH:1}    History of Present Illness:   Nicole Carr is a 53 y.o. female with h/o syncope and HTN seen today for routine electrophysiology followup.   Had prior syncope in July, and again in December. With no arrhyhtmia on prior monitoring, planned for loop recorder implantation.   Since last being seen in our clinic the patient reports doing ***.  she denies chest pain, palpitations, dyspnea, PND, orthopnea, nausea, vomiting, dizziness, syncope, edema, weight gain, or early satiety.   Review of systems complete and found to be negative unless listed in HPI.   EP Information / Studies Reviewed:    EKG is not ordered today. EKG from 05/22/2023 reviewed which showed NSR with stable intervals       Arrhythmia/Device History Monitor 01/2023 NSR 52-125 bpm, AVG 73 bpm No AF, SVT, VT, or AV block  <1% PACs and PVCs  Patient triggered events: 1.  Underlying rhythm sinus.  Echo 01/05/2023: EF 60-65%, Mild MR   ETT 01/05/2023: Exercise time 9 minutes 11 seconds on Bruce protocol, achieved 10.44 METS, 76 % of age-predicted maximum heart rate (APMHR).  Stress ECG non-diagnostic for ischemia as patient did not achieve 85% APMHR.  However, at the current workload stress ECG is low probability for ischemia.  Consider additional testing if clinical indicated.      Physical Exam:   VS:  LMP 03/06/2018    Wt Readings from Last 3 Encounters:  05/22/23 183 lb (83 kg)  05/18/23 182 lb (82.6 kg)  02/06/23 163 lb (73.9 kg)     GEN: No acute distress NECK: No JVD; No carotid bruits CARDIAC: {EPRHYTHM:28826}, no murmurs, rubs, gallops RESPIRATORY:  Clear to auscultation without rales, wheezing or rhonchi  ABDOMEN: Soft, non-tender,  non-distended EXTREMITIES:  {EDEMA LEVEL:28147::"No"} edema; No deformity   ASSESSMENT AND PLAN:    Syncope Infrequent, with no clear cause or arryhtmia on monitoring.  Explained risks, benefits, and alternatives to Loop recorder implantation, including but not limited to bleeding and infection.  Pt verbalized understanding and agrees to proceed.      ***     {Click here to Review PMH, Prob List, Meds, Allergies, SHx, FHx  :1}   Follow up with {WUJWJ:19147} {EPFOLLOW WG:95621}  Signed, Graciella Freer, PA-C

## 2023-06-01 ENCOUNTER — Ambulatory Visit: Payer: Managed Care, Other (non HMO) | Admitting: Student

## 2023-06-16 ENCOUNTER — Ambulatory Visit: Payer: Managed Care, Other (non HMO) | Admitting: Student

## 2023-06-16 NOTE — Progress Notes (Signed)
  Electrophysiology Office Note:   Date:  06/19/2023  ID:  Nicole Carr, DOB October 24, 1970, MRN [id number]  Primary Cardiologist: Tessa Lerner, DO Electrophysiologist: Will Jorja Loa, MD      History of Present Illness:   Nicole Carr is a 53 y.o. female with h/o syncope and HTN seen today for routine electrophysiology followup.   Had prior syncope in July, and again in December. With no arrhyhtmia on prior monitoring, planned for loop recorder implantation.   Since last being seen in our clinic the patient reports doing about the same. She had syncope as recently as last Wednesday, with similar presentation of rapid LOC without clear prodrome other than "tunnel vision". Otherwise, she denies chest pain, palpitations, dyspnea, PND, orthopnea, nausea, vomiting, dizziness, edema, weight gain, or early satiety. She is agreeable to loop recorder implant today.   Review of systems complete and found to be negative unless listed in HPI.   EP Information / Studies Reviewed:    EKG is not ordered today. EKG from 05/22/2023 reviewed which showed NSR with stable intervals       Arrhythmia/Device History Monitor 01/2023 NSR 52-125 bpm, AVG 73 bpm No AF, SVT, VT, or AV block  <1% PACs and PVCs  Patient triggered events: 1.  Underlying rhythm sinus.  Echo 01/05/2023: EF 60-65%, Mild MR   ETT 01/05/2023: Exercise time 9 minutes 11 seconds on Bruce protocol, achieved 10.44 METS, 76 % of age-predicted maximum heart rate (APMHR).  Stress ECG non-diagnostic for ischemia as patient did not achieve 85% APMHR.  However, at the current workload stress ECG is low probability for ischemia.  Consider additional testing if clinical indicated.      Physical Exam:   VS:  BP 122/80 (BP Location: Left Arm, Patient Position: Sitting, Cuff Size: Large)   Pulse 82   Resp 16   Ht 5\' 3"  (1.6 m)   Wt 186 lb 6.4 oz (84.6 kg)   LMP 03/06/2018   SpO2 98%   BMI 33.02 kg/m    Wt Readings from Last 3  Encounters:  06/19/23 186 lb 6.4 oz (84.6 kg)  05/22/23 183 lb (83 kg)  05/18/23 182 lb (82.6 kg)     GEN: No acute distress NECK: No JVD; No carotid bruits CARDIAC: Regular rate and rhythm, no murmurs, rubs, gallops RESPIRATORY:  Clear to auscultation without rales, wheezing or rhonchi  ABDOMEN: Soft, non-tender, non-distended EXTREMITIES:  No edema; No deformity   ASSESSMENT AND PLAN:    Syncope Infrequent, with no clear cause or arryhtmia on monitoring.  Explained risks, benefits, and alternatives to Loop recorder implantation, including but not limited to bleeding and infection.  Pt verbalized understanding and agrees to proceed.  Wound care and monitoring reviewed with pt. Care instructions placed in AVS.   Follow up with EP APP in 6-8 weeks.   Signed, Graciella Freer, PA-C

## 2023-06-19 ENCOUNTER — Ambulatory Visit: Payer: Managed Care, Other (non HMO) | Attending: Student | Admitting: Student

## 2023-06-19 ENCOUNTER — Encounter: Payer: Self-pay | Admitting: Student

## 2023-06-19 VITALS — BP 122/80 | HR 82 | Resp 16 | Ht 63.0 in | Wt 186.4 lb

## 2023-06-19 DIAGNOSIS — R55 Syncope and collapse: Secondary | ICD-10-CM | POA: Diagnosis not present

## 2023-06-19 NOTE — Patient Instructions (Addendum)
 Medication Instructions:  Your physician recommends that you continue on your current medications as directed. Please refer to the Current Medication list given to you today.  *If you need a refill on your cardiac medications before your next appointment, please call your pharmacy*  Lab Work: None ordered If you have labs (blood work) drawn today and your tests are completely normal, you will receive your results only by: MyChart Message (if you have MyChart) OR A paper copy in the mail If you have any lab test that is abnormal or we need to change your treatment, we will call you to review the results.  Follow-Up: At [facility], you and your health needs are our priority.  As part of our continuing mission to provide you with exceptional heart care, we have created designated Provider Care Teams.  These Care Teams include your primary Cardiologist (physician) and Advanced Practice Providers (APPs -  Physician Assistants and Nurse Practitioners) who all work together to provide you with the care you need, when you need it.  Your next appointment:   6-8 week(s)  Provider:   Casimiro Needle "Otilio Saber, PA-C              Care After Your Loop Recorder  You have a Medtronic Loop Recorder   Monitor your cardiac device site for redness, swelling, and drainage. Call the device clinic at [phone] if you experience these symptoms or fever/chills.  If you notice bleeding from your site, hold firm, but gently pressure with two fingers for 5 minutes. Dried blood on the steri-strips when removing the outer bandage is normal.   Keep the large square bandage on your site for 24 hours and then you may remove it yourself. Keep the steri-strips underneath in place.   You may shower after 72 hours / 3 days from your procedure with the steri-strips in place. They will usually fall off on their own, or may be removed after 10 days. Pat dry.   Avoid lotions, ointments, or perfumes over your  incision until it is well-healed.  Please do not submerge in water until your site is completely healed.   Your device is MRI compatible.   Remote monitoring is used to monitor your cardiac device from home. This monitoring is scheduled every month by our office. It allows [place] to keep an eye on the function of your device to ensure it is working properly.

## 2023-06-19 NOTE — Progress Notes (Signed)
 SURGEON:  Graciella Freer, PA-C     PREPROCEDURE DIAGNOSIS:  Syncope    POSTPROCEDURE DIAGNOSIS: Syncope     PROCEDURES:   1. Implantable loop recorder implantation    INTRODUCTION:  Nicole Carr presents with a history of syncope The costs of loop recorder monitoring have been discussed with the patient.    DESCRIPTION OF PROCEDURE:  Informed written consent was obtained.   Time Out Completed with [name]    The patient required no sedation for the procedure today.  Mapping over the patient's chest was performed to identify the area where electrograms were most prominent for ILR recording.  This area was found to be the left parasternal region over the 4th intercostal space. The patients left chest was therefore prepped and draped in the usual sterile fashion. The skin overlying the left parasternal region was infiltrated with lidocaine for local analgesia.  A 0.5-cm incision was made over the left parasternal region over the 3rd intercostal space.  A subcutaneous ILR pocket was fashioned using a combination of sharp and blunt dissection.  A Medtronic Reveal LINQ 2 implantable loop recorder (serial # [id number]) was then placed into the pocket  R waves were very prominent and measuring >0.67mV.  Steri- Strips and a sterile dressing were then applied.  There were no early apparent complications.     CONCLUSIONS:   1. Successful implantation of a implantable loop recorder for a history of Syncope.  2. No early apparent complications.   Graciella Freer, PA-C  Cardiac Electrophysiology

## 2023-07-04 ENCOUNTER — Emergency Department (HOSPITAL_COMMUNITY)

## 2023-07-04 ENCOUNTER — Encounter (HOSPITAL_COMMUNITY): Payer: Self-pay

## 2023-07-04 ENCOUNTER — Other Ambulatory Visit: Payer: Self-pay

## 2023-07-04 ENCOUNTER — Inpatient Hospital Stay (HOSPITAL_COMMUNITY)
Admission: EM | Admit: 2023-07-04 | Discharge: 2023-07-07 | DRG: 244 | Disposition: A | Discharge status: Home / Self Care | Attending: Cardiology | Admitting: Cardiology

## 2023-07-04 ENCOUNTER — Telehealth: Payer: Self-pay

## 2023-07-04 DIAGNOSIS — R55 Syncope and collapse: Principal | ICD-10-CM | POA: Diagnosis present

## 2023-07-04 DIAGNOSIS — Z87891 Personal history of nicotine dependence: Secondary | ICD-10-CM | POA: Diagnosis not present

## 2023-07-04 DIAGNOSIS — Z825 Family history of asthma and other chronic lower respiratory diseases: Secondary | ICD-10-CM

## 2023-07-04 DIAGNOSIS — Z8249 Family history of ischemic heart disease and other diseases of the circulatory system: Secondary | ICD-10-CM | POA: Diagnosis not present

## 2023-07-04 DIAGNOSIS — I495 Sick sinus syndrome: Secondary | ICD-10-CM | POA: Diagnosis present

## 2023-07-04 DIAGNOSIS — E785 Hyperlipidemia, unspecified: Secondary | ICD-10-CM | POA: Diagnosis present

## 2023-07-04 DIAGNOSIS — Z803 Family history of malignant neoplasm of breast: Secondary | ICD-10-CM

## 2023-07-04 DIAGNOSIS — R001 Bradycardia, unspecified: Secondary | ICD-10-CM | POA: Diagnosis not present

## 2023-07-04 DIAGNOSIS — I10 Essential (primary) hypertension: Secondary | ICD-10-CM | POA: Diagnosis present

## 2023-07-04 DIAGNOSIS — Z79899 Other long term (current) drug therapy: Secondary | ICD-10-CM

## 2023-07-04 DIAGNOSIS — G4733 Obstructive sleep apnea (adult) (pediatric): Secondary | ICD-10-CM | POA: Diagnosis present

## 2023-07-04 LAB — BRAIN NATRIURETIC PEPTIDE: B Natriuretic Peptide: 24.8 pg/mL (ref 0.0–100.0)

## 2023-07-04 LAB — URINALYSIS, ROUTINE W REFLEX MICROSCOPIC
Bilirubin Urine: NEGATIVE
Glucose, UA: NEGATIVE mg/dL
Hgb urine dipstick: NEGATIVE
Ketones, ur: NEGATIVE mg/dL
Leukocytes,Ua: NEGATIVE
Nitrite: NEGATIVE
Protein, ur: NEGATIVE mg/dL
Specific Gravity, Urine: 1.004 — ABNORMAL LOW (ref 1.005–1.030)
pH: 5 (ref 5.0–8.0)

## 2023-07-04 LAB — HEPATIC FUNCTION PANEL
ALT: 25 U/L (ref 0–44)
AST: 22 U/L (ref 15–41)
Albumin: 3.7 g/dL (ref 3.5–5.0)
Alkaline Phosphatase: 56 U/L (ref 38–126)
Bilirubin, Direct: 0.1 mg/dL (ref 0.0–0.2)
Indirect Bilirubin: 0.7 mg/dL (ref 0.3–0.9)
Total Bilirubin: 0.8 mg/dL (ref 0.0–1.2)
Total Protein: 7 g/dL (ref 6.5–8.1)

## 2023-07-04 LAB — BASIC METABOLIC PANEL
Anion gap: 9 (ref 5–15)
BUN: 10 mg/dL (ref 6–20)
CO2: 25 mmol/L (ref 22–32)
Calcium: 9.3 mg/dL (ref 8.9–10.3)
Chloride: 105 mmol/L (ref 98–111)
Creatinine, Ser: 0.73 mg/dL (ref 0.44–1.00)
GFR, Estimated: >60 mL/min (ref >60)
Glucose, Bld: 99 mg/dL (ref 70–99)
Potassium: 4 mmol/L (ref 3.5–5.1)
Sodium: 139 mmol/L (ref 135–145)

## 2023-07-04 LAB — CBC
HCT: 38.1 % (ref 36.0–46.0)
Hemoglobin: 12.7 g/dL (ref 12.0–15.0)
MCH: 30.5 pg (ref 26.0–34.0)
MCHC: 33.3 g/dL (ref 30.0–36.0)
MCV: 91.6 fL (ref 80.0–100.0)
Platelets: 233 10*3/uL (ref 150–400)
RBC: 4.16 MIL/uL (ref 3.87–5.11)
RDW: 12.1 % (ref 11.5–15.5)
WBC: 6.4 10*3/uL (ref 4.0–10.5)
nRBC: 0 % (ref 0.0–0.2)

## 2023-07-04 LAB — TSH: TSH: 1.777 u[IU]/mL (ref 0.350–4.500)

## 2023-07-04 LAB — MAGNESIUM: Magnesium: 2 mg/dL (ref 1.7–2.4)

## 2023-07-04 LAB — TROPONIN I (HIGH SENSITIVITY)
Troponin I (High Sensitivity): 3 ng/L
Troponin I (High Sensitivity): 3 ng/L (ref <18)

## 2023-07-04 MED ORDER — ACETAMINOPHEN 325 MG PO TABS
650.0000 mg | ORAL_TABLET | ORAL | Status: DC | PRN
  Administered 2023-07-05: 650 mg via ORAL
  Filled 2023-07-04: qty 2

## 2023-07-04 MED ORDER — NITROGLYCERIN 0.4 MG SL SUBL: 0.4000 mg | SUBLINGUAL_TABLET | SUBLINGUAL | Status: DC | PRN

## 2023-07-04 MED ORDER — LORAZEPAM 0.5 MG PO TABS: 0.5000 mg | ORAL_TABLET | Freq: Three times a day (TID) | ORAL | Status: DC | PRN

## 2023-07-04 MED ORDER — SODIUM CHLORIDE 0.9% FLUSH: 3.0000 mL | INTRAVENOUS | Status: DC | PRN

## 2023-07-04 MED ORDER — SODIUM CHLORIDE 0.9% FLUSH
3.0000 mL | Freq: Two times a day (BID) | INTRAVENOUS | Status: DC
  Administered 2023-07-04 – 2023-07-07 (×5): 3 mL via INTRAVENOUS

## 2023-07-04 MED ORDER — SODIUM CHLORIDE 0.9% FLUSH
3.0000 mL | Freq: Two times a day (BID) | INTRAVENOUS | Status: DC
  Administered 2023-07-05 – 2023-07-06 (×2): 3 mL via INTRAVENOUS

## 2023-07-04 MED ORDER — SODIUM CHLORIDE 0.9 % IV SOLN
80.0000 mg | INTRAVENOUS | Status: AC
  Filled 2023-07-04: qty 2

## 2023-07-04 MED ORDER — CHLORHEXIDINE GLUCONATE 4 % EX SOLN
60.0000 mL | Freq: Once | CUTANEOUS | Status: AC
  Administered 2023-07-04: 4 via TOPICAL
  Filled 2023-07-04: qty 60

## 2023-07-04 MED ORDER — SERTRALINE HCL 100 MG PO TABS: 100.0000 mg | ORAL_TABLET | Freq: Every day | ORAL | Status: DC

## 2023-07-04 MED ORDER — CHLORHEXIDINE GLUCONATE 4 % EX SOLN
60.0000 mL | Freq: Once | CUTANEOUS | Status: AC
  Administered 2023-07-05: 4 via TOPICAL
  Filled 2023-07-04 (×2): qty 60

## 2023-07-04 MED ORDER — SODIUM CHLORIDE 0.9 % IV SOLN: 250.0000 mL | INTRAVENOUS | Status: AC

## 2023-07-04 MED ORDER — CEFAZOLIN SODIUM-DEXTROSE 2-4 GM/100ML-% IV SOLN: 2.0000 g | INTRAVENOUS | Status: AC

## 2023-07-04 MED ORDER — BUPROPION HCL ER (XL) 150 MG PO TB24: 150.0000 mg | ORAL_TABLET | Freq: Every morning | ORAL | Status: DC

## 2023-07-04 NOTE — ED Notes (Signed)
 Report given to catherine RN. No questions at this time, Pt is in no new onset distress

## 2023-07-04 NOTE — ED Triage Notes (Signed)
 Pt states she passed out yesterday. Pt has been wearing a loop recorder for past month and the monitoring service called her and told her it showed that her heart stopped yesterday and she needed to come to the ED for eval. Pt denies pain, shortness of breath or nausea.

## 2023-07-04 NOTE — ED Provider Notes (Signed)
 [place] EMERGENCY DEPARTMENT AT [facility] Provider Note   CSN: [id number] Arrival date & time: 07/04/23  4098     History  Chief Complaint  Patient presents with   Loss of Consciousness    Nicole Carr is a 53 y.o. female.  53 year old female presents today for concern of syncope and loop recorder indicating pauses.  Episode occurred yesterday.  She was called today to report to the emergency department.  Currently asymptomatic.  The history is provided by the patient. No language interpreter was used.       Home Medications Prior to Admission medications   Medication Sig Start Date End Date Taking? Authorizing Provider  Cholecalciferol (VITAMIN D3) 5000 units CAPS Take 5,000 Units by mouth daily.   Yes [provider]  ibuprofen (ADVIL) 200 MG tablet Take 400 mg by mouth every 6 (six) hours as needed for moderate pain (pain score 4-6).   Yes [provider]  LORazepam (ATIVAN) 0.5 MG tablet Take 0.5 mg by mouth 3 (three) times daily as needed for anxiety.   Yes [provider]  buPROPion (WELLBUTRIN XL) 150 MG 24 hr tablet Take 150 mg by mouth every morning. Patient not taking: Reported on 06/19/2023    [provider]  sertraline (ZOLOFT) 100 MG tablet Take 100 mg by mouth daily. Patient not taking: Reported on 06/19/2023    [provider]      Allergies    Patient has no known allergies.    Review of Systems   Review of Systems  Constitutional:  Negative for chills and fever.  Respiratory:  Negative for shortness of breath.   Cardiovascular:  Negative for chest pain.  Neurological:  Positive for syncope.  All other systems reviewed and are negative.   Physical Exam Updated Vital Signs BP (!) 158/97   Pulse 72   Temp 98.2 F (36.8 C)   Resp 16   Ht 5\' 3"  (1.6 m)   Wt 79.4 kg   LMP 03/06/2018   SpO2 95%   BMI 31.00 kg/m  Physical Exam Vitals and nursing note reviewed.  Constitutional:       General: She is not in acute distress.    Appearance: Normal appearance. She is not ill-appearing.  HENT:     Head: Normocephalic and atraumatic.     Nose: Nose normal.  Eyes:     Conjunctiva/sclera: Conjunctivae normal.  Cardiovascular:     Rate and Rhythm: Normal rate. Rhythm irregular.  Pulmonary:     Effort: Pulmonary effort is normal. No respiratory distress.  Musculoskeletal:        General: No deformity. Normal range of motion.     Cervical back: Normal range of motion.  Skin:    Findings: No rash.  Neurological:     General: No focal deficit present.     Mental Status: She is alert.     ED Results / Procedures / Treatments   Labs (all labs ordered are listed, but only abnormal results are displayed) Labs Reviewed  URINALYSIS, ROUTINE W REFLEX MICROSCOPIC - Abnormal; Notable for the following components:      Result Value   Color, Urine STRAW (*)    Specific Gravity, Urine 1.004 (*)    All other components within normal limits  BASIC METABOLIC PANEL  CBC  TSH  MAGNESIUM  BRAIN NATRIURETIC PEPTIDE  HEPATIC FUNCTION PANEL  CBG MONITORING, ED  TROPONIN I (HIGH SENSITIVITY)  TROPONIN I (HIGH SENSITIVITY)    EKG  EKG Interpretation Date/Time:  Tuesday July 04 2023 09:37:20 EST Ventricular Rate:  72 PR Interval:  136 QRS Duration:  94 QT Interval:  396 QTC Calculation: 433 R Axis:   56  Text Interpretation: Normal sinus rhythm Normal ECG When compared with ECG of 22-May-2023 14:28, PREVIOUS ECG IS PRESENT when compared to prior, similar appearance. no STEMI Confirmed by Theda Belfast ([id number]) on 07/04/2023 9:59:25 AM  Radiology DG Chest 2 View Result Date: 07/04/2023 CLINICAL DATA:  Syncope EXAM: CHEST - 2 VIEW COMPARISON:  X-ray 11/07/2022. FINDINGS: No consolidation, pneumothorax or effusion. No edema. Normal cardiopericardial silhouette. Loop recorder overlying the left mid thorax. Degenerative changes along the spine. IMPRESSION: No acute cardiopulmonary  disease.  Loop recorder. Electronically Signed   By: Karen Kays M.D.   On: 07/04/2023 12:05    Procedures Procedures    Medications Ordered in ED Medications  nitroGLYCERIN (NITROSTAT) SL tablet 0.4 mg (has no administration in time range)  acetaminophen (TYLENOL) tablet 650 mg (has no administration in time range)  sodium chloride flush (NS) 0.9 % injection 3 mL (has no administration in time range)    ED Course/ Medical Decision Making/ A&P                                 Medical Decision Making Amount and/or Complexity of Data Reviewed Labs: ordered.  Risk Decision regarding hospitalization.   Medical Decision Making / ED Course   This patient presents to the ED for concern of syncope, sinus pause, this involves an extensive number of treatment options, and is a complaint that carries with it a high risk of complications and morbidity.  The differential diagnosis includes heart block  MDM: 53 year old female presents today for concern of heart block on her loop recorder.  She was called by cardiology.  Currently asymptomatic.  Notified cardiology that patient is in the emergency department.  They will evaluate for admission.  Lab Tests: -I ordered, reviewed, and interpreted labs.   The pertinent results include:   Labs Reviewed  URINALYSIS, ROUTINE W REFLEX MICROSCOPIC - Abnormal; Notable for the following components:      Result Value   Color, Urine STRAW (*)    Specific Gravity, Urine 1.004 (*)    All other components within normal limits  BASIC METABOLIC PANEL  CBC  TSH  MAGNESIUM  BRAIN NATRIURETIC PEPTIDE  HEPATIC FUNCTION PANEL  CBG MONITORING, ED  TROPONIN I (HIGH SENSITIVITY)  TROPONIN I (HIGH SENSITIVITY)      EKG  EKG Interpretation Date/Time:  Tuesday July 04 2023 09:37:20 EST Ventricular Rate:  72 PR Interval:  136 QRS Duration:  94 QT Interval:  396 QTC Calculation: 433 R Axis:   56  Text Interpretation: Normal sinus rhythm Normal  ECG When compared with ECG of 22-May-2023 14:28, PREVIOUS ECG IS PRESENT when compared to prior, similar appearance. no STEMI Confirmed by Theda Belfast ([id number]) on 07/04/2023 9:59:25 AM        Medicines ordered and prescription drug management: Meds ordered this encounter  Medications   nitroGLYCERIN (NITROSTAT) SL tablet 0.4 mg   acetaminophen (TYLENOL) tablet 650 mg   sodium chloride flush (NS) 0.9 % injection 3 mL    -I have reviewed the patients home medicines and have made adjustments as needed    Consultations Obtained: I requested consultation with the cardiology,  and discussed lab and imaging findings as well as pertinent plan - they recommend:  As above   Cardiac Monitoring: The patient was maintained on a cardiac monitor.  I personally viewed and interpreted the cardiac monitored which showed an underlying rhythm of: Normal sinus rhythm   Reevaluation: After the interventions noted above, I reevaluated the patient and found that they have :stayed the same  Co morbidities that complicate the patient evaluation  Past Medical History:  Diagnosis Date   Family history of adverse reaction to anesthesia    mother was slow to wake up   Hypertension       Dispostion: Discussed with cardiology.  They will evaluate patient for admission.   Final Clinical Impression(s) / ED Diagnoses Final diagnoses:  None    Rx / DC Orders ED Discharge Orders     None         Marita Kansas, PA-C 07/04/23 1248    Glyn Ade, MD 07/04/23 1529
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
## 2023-07-04 NOTE — Telephone Encounter (Addendum)
 Alert received from [employer]:  Alert remote transmission:  Pause and symptom 1 pause episode 3/3 @ 07:28, 12sec per device, sinus pause - route to triage high alert per protocol  Outreach made to Pt.  Per Pt she thinks she "was out" during this event.  She symptom activated when she was able to post event.  Advised Pt to not eat anything this morning. Advised would discuss with physician and call Pt right back.  Returned call to Pt after discussing with AT.  Pt advised to go to the ER.  She is advised NOT to drive herself.  She is aware and will have her husband take her to [facility] ER.

## 2023-07-05 DIAGNOSIS — R55 Syncope and collapse: Secondary | ICD-10-CM | POA: Diagnosis not present

## 2023-07-05 DIAGNOSIS — R001 Bradycardia, unspecified: Secondary | ICD-10-CM

## 2023-07-05 LAB — SURGICAL PCR SCREEN
MRSA, PCR: NEGATIVE
Staphylococcus aureus: NEGATIVE

## 2023-07-05 MED ORDER — CHLORHEXIDINE GLUCONATE 4 % EX SOLN
60.0000 mL | Freq: Once | CUTANEOUS | Status: AC
  Administered 2023-07-05: 4 via TOPICAL
  Filled 2023-07-05: qty 60

## 2023-07-05 MED ORDER — CHLORHEXIDINE GLUCONATE 4 % EX SOLN
60.0000 mL | Freq: Once | CUTANEOUS | Status: AC
  Administered 2023-07-06: 4 via TOPICAL
  Filled 2023-07-05: qty 60

## 2023-07-05 NOTE — Progress Notes (Signed)
 Due to mechanical problems in the cath lab, cases have been canceled for today Plan for PPM implant tomorrow Patient is aware Resume diet NPO after MN  Francis Dowse, PA-C

## 2023-07-05 NOTE — Progress Notes (Signed)
 Rounding Note    Patient Name: Nicole Carr Date of Encounter: 07/05/2023   [facility] Cardiologist: Tessa Lerner, DO   Subjective   No complaints, no symptoms  Inpatient Medications    Scheduled Meds:  gentamicin (GARAMYCIN) 80 mg in sodium chloride 0.9 % 500 mL irrigation  80 mg Irrigation On Call   sodium chloride flush  3 mL Intravenous Q12H   sodium chloride flush  3-10 mL Intravenous Q12H   Continuous Infusions:  sodium chloride      ceFAZolin (ANCEF) IV     PRN Meds: acetaminophen, LORazepam, nitroGLYCERIN, sodium chloride flush, sodium chloride flush   Vital Signs    Vitals:   07/04/23 1842 07/04/23 1934 07/05/23 0038 07/05/23 0600  BP: (!) 154/92 (!) 165/94 (!) 151/96 (!) 142/87  Pulse: 89 84 90 94  Resp: 18 18 20 18   Temp: 98.7 F (37.1 C) 98.5 F (36.9 C) 98.1 F (36.7 C) 98.1 F (36.7 C)  TempSrc: Oral Oral Oral Oral  SpO2: 100% 96% 98% 96%  Weight:      Height:        Intake/Output Summary (Last 24 hours) at 07/05/2023 0815 Last data filed at 07/04/2023 2100 Gross per 24 hour  Intake 483 ml  Output --  Net 483 ml      07/04/2023    9:45 AM 06/19/2023    8:25 AM 05/22/2023    2:25 PM  Last 3 Weights  Weight (lbs) 175 lb 186 lb 6.4 oz 183 lb  Weight (kg) 79.379 kg 84.55 kg 83.008 kg      Telemetry    SR, no pausing here - Personally Reviewed  ECG    No new EKGs - Personally Reviewed  Physical Exam   GEN: No acute distress.   Neck: No JVD Cardiac: RRR, no murmurs, rubs, or gallops.  Respiratory: CTA b/l GI: Soft, nontender, non-distended  MS: No edema; No deformity. Neuro:  Nonfocal  Psych: Normal affect   Labs    High Sensitivity Troponin:   Recent Labs  Lab 07/04/23 1010 07/04/23 1252  TROPONINIHS 3 3     Chemistry Recent Labs  Lab 07/04/23 0957 07/04/23 1010  NA 139  --   K 4.0  --   CL 105  --   CO2 25  --   GLUCOSE 99  --   BUN 10  --   CREATININE 0.73  --   CALCIUM 9.3  --   MG  --  2.0   PROT  --  7.0  ALBUMIN  --  3.7  AST  --  22  ALT  --  25  ALKPHOS  --  56  BILITOT  --  0.8  GFRNONAA >60  --   ANIONGAP 9  --     Lipids No results for input(s): "CHOL", "TRIG", "HDL", "LABVLDL", "LDLCALC", "CHOLHDL" in the last 168 hours.  Hematology Recent Labs  Lab 07/04/23 0957  WBC 6.4  RBC 4.16  HGB 12.7  HCT 38.1  MCV 91.6  MCH 30.5  MCHC 33.3  RDW 12.1  PLT 233   Thyroid  Recent Labs  Lab 07/04/23 1010  TSH 1.777    BNP Recent Labs  Lab 07/04/23 1010  BNP 24.8    DDimer No results for input(s): "DDIMER" in the last 168 hours.   Radiology    DG Chest 2 View Result Date: 07/04/2023 CLINICAL DATA:  Syncope EXAM: CHEST - 2 VIEW COMPARISON:  X-ray 11/07/2022. FINDINGS: No  consolidation, pneumothorax or effusion. No edema. Normal cardiopericardial silhouette. Loop recorder overlying the left mid thorax. Degenerative changes along the spine. IMPRESSION: No acute cardiopulmonary disease.  Loop recorder. Electronically Signed   By: Karen Kays M.D.   On: 07/04/2023 12:05    Cardiac Studies   Echocardiogram: 01/05/2023: Normal LV systolic function with visual EF 60-65%. Left ventricle cavity is normal in size. Normal left ventricular wall thickness. Normal global wall motion. Normal diastolic filling pattern, normal LAP.  Mild (Grade I) mitral regurgitation. No prior study for comparison.    Stress Testing: Exercise treadmill stress test 01/05/2023: Functional status: Good.  Chest pain: No.  Reason for stopping exercise: Fatigue/weakness.  Hypertensive response to exercise: No.  Exercise time 9 minutes 11 seconds on Bruce protocol, achieved 10.44 METS, 76 % of age-predicted maximum heart rate (APMHR).  Stress ECG non-diagnostic for ischemia as patient did not achieve 85% APMHR.  However, at the current workload stress ECG is low probability for ischemia.  Consider additional testing if clinical indicated.    Cardiac monitor (Zio Patch): Dominant  rhythm sinus. Heart rate 52-125, bpm. Avg HR 73 bpm. No atrial fibrillation detected during the monitoring period. No supraventricular tachycardia, ventricular tachycardia, high grade AV block, pauses (3 seconds or longer). Total supraventricular ectopic burden <1%. Total ventricular ectopic burden <1%. Patient triggered events: 1. Underlying rhythm sinus.    CT Cardiac Scoring: 01/18/2021 Total CAC 0 AU. Noncardiac findings: No acute or significant extracardiac abnormality.    Patient Profile     53 y.o. female w/PMHx of HTN, HLD, OSA, syncope >>> recurrent syncope with profound SND/sinus pauses > malignant vasovagal syncope  Assessment & Plan    Recurrent syncope SND, prolonged pause 3..  malignant vasovagal syncope  PPM today, patient remains agreeable Anticipate discharge tomorrow   For questions or updates, please contact [facility] Please consult [email or web address] for contact info under        Signed, Sheilah Pigeon, PA-C  07/05/2023, 8:15 AM

## 2023-07-05 NOTE — TOC CM/SW Note (Signed)
 Transition of Care [facility]) - Inpatient Brief Assessment   Patient Details  Name: Nicole Carr MRN: [id number] Date of Birth: [birth year]  Transition of Care [facility]) CM/SW Contact:    Gala Lewandowsky, RN Phone Number: 07/05/2023, 2:33 PM   Clinical Narrative: Patient plan for PPM 07-06-23. PTA patient was from home with spouse. No home needs identified at the time of visit.    Transition of Care Asessment: Insurance and Status: Insurance coverage has been reviewed Patient has primary care physician: Yes Home environment has been reviewed: reviewed Prior level of function:: independent Prior/Current Home Services: No current home services Social Drivers of Health Review: SDOH reviewed no interventions necessary Readmission risk has been reviewed: Yes Transition of care needs: no transition of care needs at this time

## 2023-07-06 ENCOUNTER — Encounter (HOSPITAL_COMMUNITY)
Admission: EM | Disposition: A | Discharge status: Home / Self Care | Payer: Self-pay | Source: Home / Self Care | Attending: Cardiology

## 2023-07-06 ENCOUNTER — Other Ambulatory Visit: Payer: Self-pay

## 2023-07-06 ENCOUNTER — Encounter (HOSPITAL_COMMUNITY): Payer: Self-pay | Admitting: Cardiology

## 2023-07-06 DIAGNOSIS — R55 Syncope and collapse: Secondary | ICD-10-CM | POA: Diagnosis not present

## 2023-07-06 DIAGNOSIS — R001 Bradycardia, unspecified: Secondary | ICD-10-CM | POA: Diagnosis not present

## 2023-07-06 HISTORY — PX: LOOP RECORDER REMOVAL: EP1215

## 2023-07-06 HISTORY — PX: PACEMAKER IMPLANT: EP1218

## 2023-07-06 SURGERY — PACEMAKER IMPLANT

## 2023-07-06 MED ORDER — MIDAZOLAM HCL 5 MG/5ML IJ SOLN
INTRAMUSCULAR | Status: AC
  Filled 2023-07-06: qty 5

## 2023-07-06 MED ORDER — CEFAZOLIN SODIUM-DEXTROSE 2-4 GM/100ML-% IV SOLN
2.0000 g | INTRAVENOUS | Status: AC
  Administered 2023-07-06: 2 g via INTRAVENOUS
  Filled 2023-07-06: qty 100

## 2023-07-06 MED ORDER — SODIUM CHLORIDE 0.9 % IV SOLN
INTRAVENOUS | Status: AC
  Administered 2023-07-06: 80 mg
  Filled 2023-07-06: qty 2

## 2023-07-06 MED ORDER — HEPARIN (PORCINE) IN NACL 1000-0.9 UT/500ML-% IV SOLN
INTRAVENOUS | Status: DC | PRN
  Administered 2023-07-06: 500 mL

## 2023-07-06 MED ORDER — FENTANYL CITRATE (PF) 100 MCG/2ML IJ SOLN
INTRAMUSCULAR | Status: AC
  Filled 2023-07-06: qty 2

## 2023-07-06 MED ORDER — MIDAZOLAM HCL 5 MG/5ML IJ SOLN
INTRAMUSCULAR | Status: DC | PRN
  Administered 2023-07-06 (×2): 1 mg via INTRAVENOUS
  Administered 2023-07-06: .5 mg via INTRAVENOUS

## 2023-07-06 MED ORDER — LIDOCAINE HCL (PF) 1 % IJ SOLN
INTRAMUSCULAR | Status: DC | PRN
  Administered 2023-07-06: 60 mL

## 2023-07-06 MED ORDER — LIDOCAINE HCL 1 % IJ SOLN
INTRAMUSCULAR | Status: AC
  Filled 2023-07-06: qty 60

## 2023-07-06 MED ORDER — CEFAZOLIN SODIUM-DEXTROSE 2-4 GM/100ML-% IV SOLN
INTRAVENOUS | Status: AC
  Filled 2023-07-06: qty 100

## 2023-07-06 MED ORDER — FENTANYL CITRATE (PF) 100 MCG/2ML IJ SOLN
INTRAMUSCULAR | Status: DC | PRN
  Administered 2023-07-06: 12.5 ug via INTRAVENOUS
  Administered 2023-07-06 (×2): 25 ug via INTRAVENOUS

## 2023-07-06 SURGICAL SUPPLY — 12 items
CABLE SURGICAL S-101-97-12 (CABLE) ×1 IMPLANT
KIT ACCESSORY SELECTRA FIX CVD (MISCELLANEOUS) IMPLANT
LEAD SELECTRA 3D-55-42 (CATHETERS) IMPLANT
LEAD SOLIA S PRO MRI 53 (Lead) IMPLANT
LEAD SOLIA S PRO MRI 60 (Lead) IMPLANT
PACEMAKER AMVIA DR-T 460163 (Pacemaker) IMPLANT
PAD DEFIB RADIO PHYSIO CONN (PAD) ×1 IMPLANT
PPM AMVIA DR-T 460163 (Pacemaker) ×1 IMPLANT
SHEATH 7FR PRELUDE SNAP 13 (SHEATH) IMPLANT
SHEATH 9FR PRELUDE SNAP 13 (SHEATH) IMPLANT
SHEATH PROBE COVER 6X72 (BAG) IMPLANT
TRAY PACEMAKER INSERTION (PACKS) ×1 IMPLANT

## 2023-07-06 NOTE — Plan of Care (Signed)
  Problem: Education: Goal: Knowledge of cardiac device and self-care will improve Outcome: Progressing Goal: Ability to safely manage health related needs after discharge will improve Outcome: Progressing   Problem: Cardiac: Goal: Ability to achieve and maintain adequate cardiopulmonary perfusion will improve Outcome: Progressing   Problem: Education: Goal: Knowledge of General Education information will improve Description: Including pain rating scale, medication(s)/side effects and non-pharmacologic comfort measures Outcome: Progressing   Problem: Health Behavior/Discharge Planning: Goal: Ability to manage health-related needs will improve Outcome: Progressing   Problem: Clinical Measurements: Goal: Ability to maintain clinical measurements within normal limits will improve Outcome: Progressing Goal: Will remain free from infection Outcome: Progressing Goal: Diagnostic test results will improve Outcome: Progressing Goal: Respiratory complications will improve Outcome: Progressing Goal: Cardiovascular complication will be avoided Outcome: Progressing   Problem: Activity: Goal: Risk for activity intolerance will decrease Outcome: Progressing   Problem: Nutrition: Goal: Adequate nutrition will be maintained Outcome: Progressing   Problem: Coping: Goal: Level of anxiety will decrease Outcome: Progressing   Problem: Elimination: Goal: Will not experience complications related to bowel motility Outcome: Progressing Goal: Will not experience complications related to urinary retention Outcome: Progressing   Problem: Pain Managment: Goal: General experience of comfort will improve and/or be controlled Outcome: Progressing   Problem: Safety: Goal: Ability to remain free from injury will improve Outcome: Progressing   Problem: Skin Integrity: Goal: Risk for impaired skin integrity will decrease Outcome: Progressing

## 2023-07-06 NOTE — Plan of Care (Signed)
   Problem: Cardiac: Goal: Ability to achieve and maintain adequate cardiovascular perfusion will improve Outcome: Progressing

## 2023-07-06 NOTE — Discharge Instructions (Addendum)
 After Your Pacemaker (and loop removal)   You have a Biotronik Pacemaker  ACTIVITY Do not lift your arm above shoulder height for 1 week after your procedure. After 7 days, you may progress as below.  You should remove your sling 24 hours after your procedure, unless otherwise instructed by your provider.     Thursday July 13, 2023  Friday July 14, 2023 Saturday July 15, 2023 Sunday July 16, 2023   Do not lift, push, pull, or carry anything over 10 pounds with the affected arm until 6 weeks (Thursday August 17, 2023 ) after your procedure.   You may drive AFTER your wound check, unless you have been told otherwise by your provider.   Ask your healthcare provider when you can go back to work   INCISION/Dressing   If large square, outer bandage is left in place, this can be removed after 24 hours from your procedure. Do not remove steri-strips or glue as below.   If a PRESSURE DRESSING (a bulky dressing that usually goes up over your shoulder) was applied or left in place, please follow instructions given by your provider on when to return to have this removed.   Monitor your Pacemaker site for redness, swelling, and drainage. Call the device clinic at [phone] if you experience these symptoms or fever/chills.  If your incision is sealed with Steri-strips or staples, you may shower 7 days after your procedure or when told by your provider. Do not remove the steri-strips or let the shower hit directly on your site. You may wash around your site with soap and water.    If you were discharged in a sling, please do not wear this during the day more than 48 hours after your surgery unless otherwise instructed. This may increase the risk of stiffness and soreness in your shoulder.   Avoid lotions, ointments, or perfumes over your incision until it is well-healed.  You may use a hot tub or a pool AFTER your wound check appointment if the incision is completely closed.  Pacemaker  Alerts:  Some alerts are vibratory and others beep. These are NOT emergencies. Please call our office to let us know. If this occurs at night or on weekends, it can wait until the next business day. Send a remote transmission.  If your device is capable of reading fluid status (for heart failure), you will be offered monthly monitoring to review this with you.   DEVICE MANAGEMENT Remote monitoring is used to monitor your pacemaker from home. This monitoring is scheduled every 91 days by our office. It allows [place] to keep an eye on the functioning of your device to ensure it is working properly. You will routinely see your Electrophysiologist annually (more often if necessary).   You should receive your ID card for your new device in 4-8 weeks. Keep this card with you at all times once received. Consider wearing a medical alert bracelet or necklace.  Your Pacemaker may be MRI compatible. This will be discussed at your next office visit/wound check.  You should avoid contact with strong electric or magnetic fields.   Do not use amateur (ham) radio equipment or electric (arc) welding torches. MP3 player headphones with magnets should not be used. Some devices are safe to use if held at least 12 inches (30 cm) from your Pacemaker. These include power tools, lawn mowers, and speakers. If you are unsure if something is safe to use, ask your health care provider.  When using your  cell phone, hold it to the ear that is on the opposite side from the Pacemaker. Do not leave your cell phone in a pocket over the Pacemaker.  You may safely use electric blankets, heating pads, computers, and microwave ovens.  Call the office right away if: You have chest pain. You feel more short of breath than you have felt before. You feel more light-headed than you have felt before. Your incision starts to open up.  This information is not intended to replace advice given to you by your health care provider. Make sure you  discuss any questions you have with your health care provider.

## 2023-07-06 NOTE — Progress Notes (Signed)
 Rounding Note    Patient Name: Nicole Carr Date of Encounter: 07/06/2023  [facility] Cardiologist: Tessa Lerner, DO   Subjective   No complaints, no symptoms  Inpatient Medications    Scheduled Meds:  sodium chloride flush  3 mL Intravenous Q12H   sodium chloride flush  3-10 mL Intravenous Q12H   Continuous Infusions:   PRN Meds: acetaminophen, LORazepam, nitroGLYCERIN, sodium chloride flush, sodium chloride flush   Vital Signs    Vitals:   07/05/23 0600 07/05/23 1452 07/05/23 2012 07/06/23 0507  BP: (!) 142/87 (!) 140/85 (!) 146/96 (!) 144/90  Pulse: 94 96 (!) 103 88  Resp: 18 18 20 18   Temp: 98.1 F (36.7 C) 98.3 F (36.8 C) 99.5 F (37.5 C) 98.5 F (36.9 C)  TempSrc: Oral Oral Oral Oral  SpO2: 96% 96% 96% 97%  Weight:      Height:       No intake or output data in the 24 hours ending 07/06/23 0738     07/04/2023    9:45 AM 06/19/2023    8:25 AM 05/22/2023    2:25 PM  Last 3 Weights  Weight (lbs) 175 lb 186 lb 6.4 oz 183 lb  Weight (kg) 79.379 kg 84.55 kg 83.008 kg      Telemetry    SR, 80's mostly, no pausing here - Personally Reviewed  ECG    No new EKGs - Personally Reviewed  Physical Exam  Unchanged exam GEN: No acute distress.   Neck: No JVD Cardiac: RRR, no murmurs, rubs, or gallops.  Respiratory: CTA b/l GI: Soft, nontender, non-distended  MS: No edema; No deformity. Neuro:  Nonfocal  Psych: Normal affect   Labs    High Sensitivity Troponin:   Recent Labs  Lab 07/04/23 1010 07/04/23 1252  TROPONINIHS 3 3     Chemistry Recent Labs  Lab 07/04/23 0957 07/04/23 1010  NA 139  --   K 4.0  --   CL 105  --   CO2 25  --   GLUCOSE 99  --   BUN 10  --   CREATININE 0.73  --   CALCIUM 9.3  --   MG  --  2.0  PROT  --  7.0  ALBUMIN  --  3.7  AST  --  22  ALT  --  25  ALKPHOS  --  56  BILITOT  --  0.8  GFRNONAA >60  --   ANIONGAP 9  --     Lipids No results for input(s): "CHOL", "TRIG", "HDL", "LABVLDL",  "LDLCALC", "CHOLHDL" in the last 168 hours.  Hematology Recent Labs  Lab 07/04/23 0957  WBC 6.4  RBC 4.16  HGB 12.7  HCT 38.1  MCV 91.6  MCH 30.5  MCHC 33.3  RDW 12.1  PLT 233   Thyroid  Recent Labs  Lab 07/04/23 1010  TSH 1.777    BNP Recent Labs  Lab 07/04/23 1010  BNP 24.8    DDimer No results for input(s): "DDIMER" in the last 168 hours.   Radiology    DG Chest 2 View Result Date: 07/04/2023 CLINICAL DATA:  Syncope EXAM: CHEST - 2 VIEW COMPARISON:  X-ray 11/07/2022. FINDINGS: No consolidation, pneumothorax or effusion. No edema. Normal cardiopericardial silhouette. Loop recorder overlying the left mid thorax. Degenerative changes along the spine. IMPRESSION: No acute cardiopulmonary disease.  Loop recorder. Electronically Signed   By: Karen Kays M.D.   On: 07/04/2023 12:05    Cardiac Studies  Echocardiogram: 01/05/2023: Normal LV systolic function with visual EF 60-65%. Left ventricle cavity is normal in size. Normal left ventricular wall thickness. Normal global wall motion. Normal diastolic filling pattern, normal LAP.  Mild (Grade I) mitral regurgitation. No prior study for comparison.    Stress Testing: Exercise treadmill stress test 01/05/2023: Functional status: Good.  Chest pain: No.  Reason for stopping exercise: Fatigue/weakness.  Hypertensive response to exercise: No.  Exercise time 9 minutes 11 seconds on Bruce protocol, achieved 10.44 METS, 76 % of age-predicted maximum heart rate (APMHR).  Stress ECG non-diagnostic for ischemia as patient did not achieve 85% APMHR.  However, at the current workload stress ECG is low probability for ischemia.  Consider additional testing if clinical indicated.    Cardiac monitor (Zio Patch): Dominant rhythm sinus. Heart rate 52-125, bpm. Avg HR 73 bpm. No atrial fibrillation detected during the monitoring period. No supraventricular tachycardia, ventricular tachycardia, high grade AV block, pauses (3 seconds  or longer). Total supraventricular ectopic burden <1%. Total ventricular ectopic burden <1%. Patient triggered events: 1. Underlying rhythm sinus.    CT Cardiac Scoring: 01/18/2021 Total CAC 0 AU. Noncardiac findings: No acute or significant extracardiac abnormality.    Patient Profile     53 y.o. female w/PMHx of HTN, HLD, OSA, syncope >>> recurrent syncope with profound SND/sinus pauses > malignant vasovagal syncope  Assessment & Plan    Recurrent syncope SND, prolonged pause 3..  malignant vasovagal syncope  PPM today, delayed yesterday 2/2 mechanical issues in the lab patient remains agreeable, no follow up questions this morning Anticipate discharge tomorrow   For questions or updates, please contact [facility] Please consult [email or web address] for contact info under        Signed, Sheilah Pigeon, PA-C  07/06/2023, 7:38 AM

## 2023-07-07 ENCOUNTER — Other Ambulatory Visit (HOSPITAL_COMMUNITY): Payer: Self-pay

## 2023-07-07 ENCOUNTER — Inpatient Hospital Stay (HOSPITAL_COMMUNITY)

## 2023-07-07 DIAGNOSIS — R55 Syncope and collapse: Secondary | ICD-10-CM | POA: Diagnosis not present
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
## 2023-07-07 NOTE — Progress Notes (Signed)
 Discharge instructions (including medications) discussed with and copy provided to patient, verbalized understanding.  PIV removed and patient assisted with dressing.

## 2023-07-19 ENCOUNTER — Ambulatory Visit: Attending: Internal Medicine

## 2023-07-19 DIAGNOSIS — R55 Syncope and collapse: Secondary | ICD-10-CM | POA: Diagnosis not present

## 2023-07-19 LAB — CUP PACEART INCLINIC DEVICE CHECK
Brady Statistic RA Percent Paced: 16 %
Brady Statistic RV Percent Paced: 0 %
Date Time Interrogation Session: 20250319202808
Implantable Lead Connection Status: 753985
Implantable Lead Connection Status: 753985
Implantable Lead Implant Date: 20250306
Implantable Lead Implant Date: 20250306
Implantable Lead Location: 753859
Implantable Lead Location: 753860
Implantable Lead Model: 377
Implantable Lead Model: 377
Implantable Lead Serial Number: 8001791471
Implantable Lead Serial Number: 8001838823
Implantable Pulse Generator Implant Date: 20250306
Pulse Gen Serial Number: 8001838823

## 2023-07-19 NOTE — Patient Instructions (Signed)

## 2023-07-19 NOTE — Progress Notes (Signed)
 Normal dual chamber pacemaker wound check. Presenting rhythm: NSR. Wound well healed. Routine testing performed. Thresholds, sensing, and impedances consistent with implant measurements and at 3.5V safety margin/auto capture until 3 month visit. No episodes. Reviewed arm restrictions to continue for 6 weeks total post op.  Pt enrolled in remote follow-up.

## 2023-07-31 ENCOUNTER — Ambulatory Visit: Payer: Managed Care, Other (non HMO) | Admitting: Student

## 2023-08-18 ENCOUNTER — Ambulatory Visit (INDEPENDENT_AMBULATORY_CARE_PROVIDER_SITE_OTHER)

## 2023-08-18 DIAGNOSIS — R55 Syncope and collapse: Secondary | ICD-10-CM | POA: Diagnosis not present

## 2023-08-23 ENCOUNTER — Encounter: Payer: Self-pay | Admitting: Cardiology

## 2023-08-29 LAB — CUP PACEART REMOTE DEVICE CHECK
Date Time Interrogation Session: 20250423125101
Implantable Lead Connection Status: 753985
Implantable Lead Connection Status: 753985
Implantable Lead Implant Date: 20250306
Implantable Lead Implant Date: 20250306
Implantable Lead Location: 753859
Implantable Lead Location: 753860
Implantable Lead Model: 377
Implantable Lead Model: 377
Implantable Lead Serial Number: 8001791471
Implantable Lead Serial Number: 8001838823
Implantable Pulse Generator Implant Date: 20250306
Pulse Gen Serial Number: 1000433228

## 2023-09-27 NOTE — Progress Notes (Signed)
 Remote pacemaker transmission.

## 2023-09-27 NOTE — Addendum Note (Signed)
 Addended by: Lott Rouleau A on: 09/27/2023 09:25 AM   Modules accepted: Orders

## 2023-10-05 ENCOUNTER — Ambulatory Visit: Admitting: Student
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
## 2023-10-17 ENCOUNTER — Ambulatory Visit: Attending: Pulmonary Disease | Admitting: Pulmonary Disease

## 2023-10-17 ENCOUNTER — Encounter: Payer: Self-pay | Admitting: Pulmonary Disease

## 2023-10-17 VITALS — BP 118/76 | Ht 63.75 in | Wt 197.0 lb

## 2023-10-17 DIAGNOSIS — Z950 Presence of cardiac pacemaker: Secondary | ICD-10-CM

## 2023-10-17 DIAGNOSIS — R55 Syncope and collapse: Secondary | ICD-10-CM

## 2023-10-17 DIAGNOSIS — E782 Mixed hyperlipidemia: Secondary | ICD-10-CM | POA: Diagnosis not present

## 2023-10-17 DIAGNOSIS — I10 Essential (primary) hypertension: Secondary | ICD-10-CM | POA: Diagnosis not present

## 2023-10-17 LAB — CUP PACEART INCLINIC DEVICE CHECK
Date Time Interrogation Session: 20250617095814
Implantable Lead Connection Status: 753985
Implantable Lead Connection Status: 753985
Implantable Lead Implant Date: 20250306
Implantable Lead Implant Date: 20250306
Implantable Lead Location: 753859
Implantable Lead Location: 753860
Implantable Lead Model: 377
Implantable Lead Model: 377
Implantable Lead Serial Number: 8001791471
Implantable Lead Serial Number: 8001838823
Implantable Pulse Generator Implant Date: 20250306
Pulse Gen Serial Number: 1000433228

## 2023-10-17 NOTE — Patient Instructions (Signed)
 Medication Instructions:  Your physician recommends that you continue on your current medications as directed. Please refer to the Current Medication list given to you today.  *If you need a refill on your cardiac medications before your next appointment, please call your pharmacy*  Lab Work: None ordered If you have labs (blood work) drawn today and your tests are completely normal, you will receive your results only by: MyChart Message (if you have MyChart) OR A paper copy in the mail If you have any lab test that is abnormal or we need to change your treatment, we will call you to review the results.  Follow-Up: At [facility], you and your health needs are our priority.  As part of our continuing mission to provide you with exceptional heart care, our providers are all part of one team.  This team includes your primary Cardiologist (physician) and Advanced Practice Providers or APPs (Physician Assistants and Nurse Practitioners) who all work together to provide you with the care you need, when you need it.  Your next appointment:   1 year(s)  Provider:   You may see Will Cortland Ding, MD or one of the following Advanced Practice Providers on your designated Care Team:   Mertha Abrahams, PA-C Michael Andy Tillery, PA-C Suzann Riddle, NP Creighton Doffing, NP

## 2023-10-19 ENCOUNTER — Ambulatory Visit: Payer: Self-pay | Admitting: Cardiology

## 2023-11-17 ENCOUNTER — Ambulatory Visit

## 2023-11-17 DIAGNOSIS — R55 Syncope and collapse: Secondary | ICD-10-CM | POA: Diagnosis not present

## 2023-11-20 LAB — CUP PACEART REMOTE DEVICE CHECK
Date Time Interrogation Session: 20250718122637
Implantable Lead Connection Status: 753985
Implantable Lead Connection Status: 753985
Implantable Lead Implant Date: 20250306
Implantable Lead Implant Date: 20250306
Implantable Lead Location: 753859
Implantable Lead Location: 753860
Implantable Lead Model: 377
Implantable Lead Model: 377
Implantable Lead Serial Number: 8001791471
Implantable Lead Serial Number: 8001838823
Implantable Pulse Generator Implant Date: 20250306
Pulse Gen Serial Number: 1000433228

## 2023-11-29 ENCOUNTER — Ambulatory Visit: Payer: Self-pay | Admitting: Cardiology

## 2024-02-01 ENCOUNTER — Other Ambulatory Visit: Payer: Self-pay | Admitting: Family Medicine

## 2024-02-01 DIAGNOSIS — Z1231 Encounter for screening mammogram for malignant neoplasm of breast: Secondary | ICD-10-CM

## 2024-02-05 ENCOUNTER — Ambulatory Visit
Admission: RE | Admit: 2024-02-05 | Discharge: 2024-02-05 | Disposition: A | Discharge status: Home / Self Care | Source: Ambulatory Visit | Attending: Family Medicine | Admitting: Family Medicine

## 2024-02-05 DIAGNOSIS — Z1231 Encounter for screening mammogram for malignant neoplasm of breast: Secondary | ICD-10-CM

## 2024-02-05 NOTE — Progress Notes (Signed)
 Remote PPM Transmission

## 2024-02-09 ENCOUNTER — Other Ambulatory Visit: Payer: Self-pay | Admitting: Family Medicine

## 2024-02-09 DIAGNOSIS — R928 Other abnormal and inconclusive findings on diagnostic imaging of breast: Secondary | ICD-10-CM

## 2024-02-16 ENCOUNTER — Ambulatory Visit (INDEPENDENT_AMBULATORY_CARE_PROVIDER_SITE_OTHER)

## 2024-02-16 DIAGNOSIS — R55 Syncope and collapse: Secondary | ICD-10-CM | POA: Diagnosis not present

## 2024-02-19 ENCOUNTER — Ambulatory Visit
Admission: RE | Admit: 2024-02-19 | Discharge: 2024-02-19 | Disposition: A | Source: Ambulatory Visit | Attending: Family Medicine | Admitting: Family Medicine

## 2024-02-19 DIAGNOSIS — R928 Other abnormal and inconclusive findings on diagnostic imaging of breast: Secondary | ICD-10-CM

## 2024-02-20 LAB — CUP PACEART REMOTE DEVICE CHECK
Date Time Interrogation Session: 20251017074329
Implantable Lead Connection Status: 753985
Implantable Lead Connection Status: 753985
Implantable Lead Implant Date: 20250306
Implantable Lead Implant Date: 20250306
Implantable Lead Location: 753859
Implantable Lead Location: 753860
Implantable Lead Model: 377
Implantable Lead Model: 377
Implantable Lead Serial Number: 8001791471
Implantable Lead Serial Number: 8001838823
Implantable Pulse Generator Implant Date: 20250306
Pulse Gen Serial Number: 1000433228

## 2024-02-22 NOTE — Progress Notes (Signed)
 Remote PPM Transmission

## 2024-02-28 ENCOUNTER — Ambulatory Visit: Payer: Self-pay | Admitting: Cardiology

## 2024-04-04 ENCOUNTER — Telehealth: Payer: Self-pay

## 2024-04-04 NOTE — Telephone Encounter (Signed)
 Biotronik alert: PPM indicated for syncope RV threshold has been trending elevated since 03/26/24.  Today's measurement: 2.1@.4ms - programmed output 4.2v@.87ms AP: 31% VP: 0% RV sensing and impedances are normal/stable.   Will need to bring patient in to assess RV lead to device clinic and test different pulse widths for optimal function and longevity. Not urgent due to VP 0% and not dependent.  Will contact patient and set up at her convenience.

## 2024-04-10 NOTE — Telephone Encounter (Signed)
 I spoke with the patient and advised her of RV threshold trending up and that we would like to schedule her an in office Device Clinic appointment to perform manuel testing on her wire and possible re-programming.   The patient voices understanding and is agreeable.  Device Clinic appointment scheduled for Wed 04/17/24 at 12:00 pm. Patient confirmed.

## 2024-04-10 NOTE — Telephone Encounter (Signed)
 Attempted to contact the patient. No answer- I left a detailed message (ok per DPR) that we would like to schedule her to come into the office to recheck her RV lead and perform some possible re-programming for optimal PPM function and battery longevity.  I asked that she please call back directly to the Device Clinic to discuss- our contact number was left on the message.

## 2024-04-17 ENCOUNTER — Ambulatory Visit: Attending: Cardiovascular Disease

## 2024-04-17 DIAGNOSIS — Z950 Presence of cardiac pacemaker: Secondary | ICD-10-CM

## 2024-04-17 DIAGNOSIS — R55 Syncope and collapse: Secondary | ICD-10-CM | POA: Diagnosis not present

## 2024-04-17 NOTE — Patient Instructions (Signed)
Patient will follow up as scheduled.

## 2024-04-18 LAB — CUP PACEART INCLINIC DEVICE CHECK
Battery Remaining Percentage: 95 %
Brady Statistic AP VP Percent: 0 %
Brady Statistic AP VS Percent: 30 %
Brady Statistic AS VP Percent: 0 %
Brady Statistic AS VS Percent: 70 %
Brady Statistic RA Percent Paced: 30 %
Brady Statistic RV Percent Paced: 0 %
Date Time Interrogation Session: 20251217122204
Implantable Lead Connection Status: 753985
Implantable Lead Connection Status: 753985
Implantable Lead Implant Date: 20250306
Implantable Lead Implant Date: 20250306
Implantable Lead Location: 753859
Implantable Lead Location: 753860
Implantable Lead Model: 377
Implantable Lead Model: 377
Implantable Lead Serial Number: 8001791471
Implantable Lead Serial Number: 8001838823
Implantable Pulse Generator Implant Date: 20250306
Lead Channel Impedance Value: 566 Ohm
Lead Channel Impedance Value: 684 Ohm
Lead Channel Pacing Threshold Amplitude: 1 V
Lead Channel Pacing Threshold Amplitude: 1.7 V
Lead Channel Pacing Threshold Pulse Width: 0.4 ms
Lead Channel Pacing Threshold Pulse Width: 0.4 ms
Lead Channel Setting Pacing Amplitude: 2 V
Lead Channel Setting Pacing Amplitude: 2.8 V
Lead Channel Setting Pacing Pulse Width: 0.4 ms
Pulse Gen Serial Number: 1000433228

## 2024-04-18 NOTE — Progress Notes (Signed)
 Patient seen in device clinic today to retest RV lead, that has had upward trending thresholds recently  R wave amplitude 11.1 mV today  RV threshold measured 1.7 V @ 0.4 mS today which is decreased from previous measurements of > 2.0 V  RV pacing 0%  Will notify provider for awareness and continue to monitor

## 2024-05-17 ENCOUNTER — Ambulatory Visit (INDEPENDENT_AMBULATORY_CARE_PROVIDER_SITE_OTHER)

## 2024-05-17 DIAGNOSIS — R55 Syncope and collapse: Secondary | ICD-10-CM | POA: Diagnosis not present

## 2024-05-20 LAB — CUP PACEART REMOTE DEVICE CHECK
Battery Voltage: 90
Date Time Interrogation Session: 20260116095954
Implantable Lead Connection Status: 753985
Implantable Lead Connection Status: 753985
Implantable Lead Implant Date: 20250306
Implantable Lead Implant Date: 20250306
Implantable Lead Location: 753859
Implantable Lead Location: 753860
Implantable Lead Model: 377
Implantable Lead Model: 377
Implantable Lead Serial Number: 8001791471
Implantable Lead Serial Number: 8001838823
Implantable Pulse Generator Implant Date: 20250306
Pulse Gen Serial Number: 1000433228

## 2024-05-21 ENCOUNTER — Ambulatory Visit: Payer: Self-pay | Admitting: Cardiology

## 2024-05-23 NOTE — Progress Notes (Signed)
 Remote PPM Transmission

## 2024-08-16 ENCOUNTER — Encounter

## 2024-11-15 ENCOUNTER — Encounter

## 2025-02-14 ENCOUNTER — Encounter

## 2025-05-16 ENCOUNTER — Encounter
